# Patient Record
Sex: Female | Born: 2000 | Hispanic: Yes | Marital: Single | Smoking: Never smoker
Health system: Southern US, Community
[De-identification: ages and names within clinical notes are randomized; demographics above are authoritative.]

## PROBLEM LIST (undated history)

## (undated) DIAGNOSIS — O1400 Mild to moderate pre-eclampsia, unspecified trimester: Secondary | ICD-10-CM

## (undated) DIAGNOSIS — G43909 Migraine, unspecified, not intractable, without status migrainosus: Secondary | ICD-10-CM

## (undated) DIAGNOSIS — Q760 Spina bifida occulta: Secondary | ICD-10-CM

## (undated) DIAGNOSIS — E669 Obesity, unspecified: Secondary | ICD-10-CM

## (undated) HISTORY — DX: Obesity, unspecified: E66.9

## (undated) HISTORY — PX: NO PAST SURGERIES: SHX2092

## (undated) HISTORY — DX: Mild to moderate pre-eclampsia, unspecified trimester: O14.00

## (undated) HISTORY — DX: Migraine, unspecified, not intractable, without status migrainosus: G43.909

## (undated) HISTORY — DX: Spina bifida occulta: Q76.0

---
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
## 2022-01-03 ENCOUNTER — Ambulatory Visit (INDEPENDENT_AMBULATORY_CARE_PROVIDER_SITE_OTHER): Payer: Medicaid Other | Admitting: Obstetrics & Gynecology

## 2022-01-03 VITALS — BP 119/88 | HR 81 | Wt 199.6 lb

## 2022-01-03 DIAGNOSIS — D1779 Benign lipomatous neoplasm of other sites: Secondary | ICD-10-CM

## 2022-01-03 DIAGNOSIS — Z6832 Body mass index (BMI) 32.0-32.9, adult: Secondary | ICD-10-CM

## 2022-01-03 DIAGNOSIS — Z3403 Encounter for supervision of normal first pregnancy, third trimester: Secondary | ICD-10-CM

## 2022-01-03 DIAGNOSIS — Z3A38 38 weeks gestation of pregnancy: Secondary | ICD-10-CM

## 2022-01-03 DIAGNOSIS — O99019 Anemia complicating pregnancy, unspecified trimester: Secondary | ICD-10-CM

## 2022-01-03 DIAGNOSIS — G43109 Migraine with aura, not intractable, without status migrainosus: Secondary | ICD-10-CM

## 2022-01-03 NOTE — Progress Notes (Signed)
   PRENATAL VISIT NOTE  Subjective:  Taylor [name] is a 21 y.o. G1P0 at 61w0dbeing seen today for ongoing prenatal care.  She is currently monitored for the following issues for this low-risk pregnancy and has Adjustment disorder of adolescence; Migraine headache; Supervision of normal first pregnancy; Body mass index (BMI) 32.0-32.9, adult; Epidural lipomatosis; and Anemia during pregnancy on their problem list.  Patient reports no complaints.  Contractions: Not present. Vag. Bleeding: None.  Movement: Present. Denies leaking of fluid.   The following portions of the patient's history were reviewed and updated as appropriate: allergies, current medications, past family history, past medical history, past social history, past surgical history and problem list.   Objective:   Vitals:   01/03/22 1107  BP: 119/88  Pulse: 81  Weight: 199 lb 9.6 oz (90.5 kg)    Fetal Status: Fetal Heart Rate (bpm): 138 Fundal Height: 38 cm Movement: Present  Presentation: Vertex  General:  Alert, oriented and cooperative. Patient is in no acute distress.  Skin: Skin is warm and dry. No rash noted.   Cardiovascular: Normal heart rate noted  Respiratory: Normal respiratory effort, no problems with respiration noted  Abdomen: Soft, gravid, appropriate for gestational age.  Pain/Pressure: Present     Pelvic: Cervical exam performed in the presence of a chaperone Dilation: Fingertip Effacement (%): 20 Station: -3  Extremities: Normal range of motion.  Edema: Trace  Mental Status: Normal mood and affect. Normal behavior. Normal judgment and thought content.   Assessment and Plan:  Pregnancy: G1P0 at 358w0d. [redacted] weeks gestation of pregnancy - on PNV, baby ASA  2. Encounter for supervision of normal first pregnancy in third trimester - recheck 1 week - discussed timing of delivery.  Will plan BPP after 40 weeks and go ahead and schedule now.  Pt aware if does not have significant cervical change,  would recommend induction at 41 weeks.  May need foley bulb placement prior to induction as well.    3. Epidural lipomatosis  4. Anemia during pregnancy - on oral iron  5. Migraine with aura and without status migrainosus, not intractable -under good control  6. Body mass index (BMI) 32.0-32.9, adult - had growth scan 12/10/2021 with 19%-tile growth  Term labor symptoms and general obstetric precautions including but not limited to vaginal bleeding, contractions, leaking of fluid and fetal movement were reviewed in detail with the patient. Please refer to After Visit Summary for other counseling recommendations.   Return in about 1 week (around 01/10/2022).  Future Appointments  Date Time Provider [place]8/21/2023 11:00 AM MiMegan SalonMD [facility]  01/17/2022 10:30 AM MiMegan SalonMD [facility]  02/14/2022  9:00 AM StCamillia HerterNP [facility] None    MaMegan SalonMD

## 2022-01-03 NOTE — Addendum Note (Signed)
Addended by: Blenda Nicely on: 01/03/2022 04:31 PM   Modules accepted: Orders

## 2022-01-04 ENCOUNTER — Encounter (HOSPITAL_BASED_OUTPATIENT_CLINIC_OR_DEPARTMENT_OTHER): Payer: Medicaid Other | Admitting: Obstetrics & Gynecology

## 2022-01-10 ENCOUNTER — Ambulatory Visit (INDEPENDENT_AMBULATORY_CARE_PROVIDER_SITE_OTHER): Payer: Medicaid Other | Admitting: Obstetrics & Gynecology

## 2022-01-10 ENCOUNTER — Encounter (HOSPITAL_BASED_OUTPATIENT_CLINIC_OR_DEPARTMENT_OTHER): Payer: Self-pay | Admitting: Obstetrics & Gynecology

## 2022-01-10 VITALS — BP 123/80 | HR 80 | Wt 197.8 lb

## 2022-01-10 DIAGNOSIS — Z6832 Body mass index (BMI) 32.0-32.9, adult: Secondary | ICD-10-CM | POA: Diagnosis not present

## 2022-01-10 DIAGNOSIS — D1779 Benign lipomatous neoplasm of other sites: Secondary | ICD-10-CM | POA: Diagnosis not present

## 2022-01-10 DIAGNOSIS — E882 Lipomatosis, not elsewhere classified: Secondary | ICD-10-CM

## 2022-01-10 DIAGNOSIS — O99019 Anemia complicating pregnancy, unspecified trimester: Secondary | ICD-10-CM

## 2022-01-10 DIAGNOSIS — O99013 Anemia complicating pregnancy, third trimester: Secondary | ICD-10-CM | POA: Diagnosis not present

## 2022-01-10 DIAGNOSIS — Z3403 Encounter for supervision of normal first pregnancy, third trimester: Secondary | ICD-10-CM

## 2022-01-10 DIAGNOSIS — Z3A39 39 weeks gestation of pregnancy: Secondary | ICD-10-CM | POA: Diagnosis not present

## 2022-01-10 NOTE — Progress Notes (Signed)
   PRENATAL VISIT NOTE  Subjective:  Taylor [name] is a 21 y.o. G1P0 at [redacted]w[redacted]d being seen today for ongoing prenatal care.  She is currently monitored for the following issues for this low-risk pregnancy and has Adjustment disorder of adolescence; Migraine headache; Supervision of normal first pregnancy; Body mass index (BMI) 32.0-32.9, adult; Epidural lipomatosis; and Anemia during pregnancy on their problem list.  Patient reports  no complaints .  Contractions: Not present. Vag. Bleeding: None.  Movement: Present. Denies leaking of fluid.   The following portions of the patient's history were reviewed and updated as appropriate: allergies, current medications, past family history, past medical history, past social history, past surgical history and problem list.   Objective:   Vitals:   01/10/22 1129  BP: 123/80  Pulse: 80  Weight: 197 lb 12.8 oz (89.7 kg)    Fetal Status: Fetal Heart Rate (bpm): 132 Fundal Height: 40 cm Movement: Present  Presentation: Vertex  General:  Alert, oriented and cooperative. Patient is in no acute distress.  Skin: Skin is warm and dry. No rash noted.   Cardiovascular: Normal heart rate noted  Respiratory: Normal respiratory effort, no problems with respiration noted  Abdomen: Soft, gravid, appropriate for gestational age.  Pain/Pressure: Present     Pelvic: Cervical exam performed in the presence of a chaperone Dilation: 1 Effacement (%): 30 Station: -3  Extremities: Normal range of motion.  Edema: Trace  Mental Status: Normal mood and affect. Normal behavior. Normal judgment and thought content.   Assessment and Plan:  Pregnancy: G1P0 at [redacted]w[redacted]d 1. [redacted] weeks gestation of pregnancy - on PNV - Recheck 1 week - BPP scheduled for after 40 weeks - induction will be scheduled for 41 weeks  2. Encounter for supervision of normal first pregnancy in third trimester  3. Epidural lipomatosis  4. Body mass index (BMI) 32.0-32.9, adult - growth scan  12/10/2021 with 19th percentile growth at 2225gm  5. Anemia during pregnancy - on iron  Term labor symptoms and general obstetric precautions including but not limited to vaginal bleeding, contractions, leaking of fluid and fetal movement were reviewed in detail with the patient. Please refer to After Visit Summary for other counseling recommendations.   Return in about 1 week (around 01/17/2022) for BPP scheduled for after 40 weeks.  Future Appointments  Date Time Provider Department Center  01/17/2022 10:30 AM Jerene Bears, MD [facility]  01/18/2022  9:45 AM [facility] NURSE [facility]  01/18/2022 10:00 AM [facility] US1 [facility]  02/14/2022  9:00 AM Rema Fendt, NP [facility] None    Jerene Bears, MD

## 2022-01-15 ENCOUNTER — Inpatient Hospital Stay (HOSPITAL_COMMUNITY)
Admission: AD | Admit: 2022-01-15 | Discharge: 2022-01-15 | Disposition: A | Discharge status: Home / Self Care | Payer: Medicaid Other | Attending: Obstetrics & Gynecology | Admitting: Obstetrics & Gynecology

## 2022-01-15 ENCOUNTER — Encounter (HOSPITAL_COMMUNITY): Payer: Self-pay | Admitting: Obstetrics & Gynecology

## 2022-01-15 DIAGNOSIS — O479 False labor, unspecified: Secondary | ICD-10-CM

## 2022-01-15 DIAGNOSIS — Z3A39 39 weeks gestation of pregnancy: Secondary | ICD-10-CM | POA: Diagnosis not present

## 2022-01-15 DIAGNOSIS — O471 False labor at or after 37 completed weeks of gestation: Secondary | ICD-10-CM | POA: Diagnosis not present

## 2022-01-15 DIAGNOSIS — O4693 Antepartum hemorrhage, unspecified, third trimester: Secondary | ICD-10-CM

## 2022-01-15 LAB — URINALYSIS, ROUTINE W REFLEX MICROSCOPIC
Bilirubin Urine: NEGATIVE
Glucose, UA: NEGATIVE mg/dL
Ketones, ur: NEGATIVE mg/dL
Nitrite: NEGATIVE
Protein, ur: NEGATIVE mg/dL
Specific Gravity, Urine: 1.004 — ABNORMAL LOW (ref 1.005–1.030)
pH: 7 (ref 5.0–8.0)

## 2022-01-15 NOTE — MAU Note (Addendum)
.  Taylor [name] is a 21 y.o. at 1w5dhere in MAU reporting: at 330am she had some red spotting with a dime size clot with irregular ctx At 705am she had another episode of a dime sized clot with spotting. Patient stated that she went to the bathroom in the lobby she seen pink discharged after wiping. Patient is reporting of cramping for 4-5 days. She stated that since yesterday the cramping was constant.  + FM reported Patient denies leakage of fluid LMP: 04/18/2021 Onset of complaint: 330am Pain score: 6/10 with ctx 4/10 with cramping and ctx Vitals:   01/15/22 0910  BP: 133/85  Pulse: 84  Resp: 12  Temp: 99 F (37.2 C)  SpO2: 100%     FHT:140  Lab orders placed from beside:   UA

## 2022-01-15 NOTE — Discharge Instructions (Signed)
Reasons to return to MAU at [facility]:  1.  Contractions are  5 minutes apart or less, each last 1 minute, these have been going on for 1-2 hours, and you cannot walk or talk during them 2.  You have a large gush of fluid, or a trickle of fluid that will not stop and you have to wear a pad 3.  You have bleeding that is bright red, heavier than spotting--like menstrual bleeding (spotting can be normal in early labor or after a check of your cervix) 4.  You do not feel the baby moving like he/she normally does  

## 2022-01-15 NOTE — MAU Provider Note (Signed)
Chief Complaint:  Contractions and Vaginal Bleeding   None     HPI: Taylor [name] is a 21 y.o. G1P0 at 54w5dby early ultrasound who presents to maternity admissions reporting irregular contractions since yesterday 01/14/22, with 2 small dime sized clots seen when wiping this morning. The bleeding has not been enough to require a pad or pantyliner.   She reports good fetal movement, denies LOF, vaginal itching/burning, urinary symptoms, h/a, dizziness, n/v, or fever/chills.      HPI  Past Medical History: Past Medical History:  Diagnosis Date   History of acute pancreatitis 05/2020   due to gallstone pancreatitis   Migraines    Obesity    Spina bifida occulta     Past obstetric history: OB History  Gravida Para Term Preterm AB Living  1            SAB IAB Ectopic Multiple Live Births               # Outcome Date GA Lbr Len/2nd Weight Sex Delivery Anes PTL Lv  1 Current             Past Surgical History: Past Surgical History:  Procedure Laterality Date   CHOLECYSTECTOMY N/A 06/12/2020   Procedure: LAPAROSCOPIC CHOLECYSTECTOMY WITH INTRAOPERATIVE CHOLANGIOGRAM;  Surgeon: WGreer Pickerel MD;  [facility]  Service: General;  Laterality: N/A;    Family History: Family History  Problem Relation Age of Onset   Anxiety disorder Mother    Asthma Sister    Hypertension Maternal Aunt    Cancer Maternal Uncle    Migraines Neg Hx    Seizures Neg Hx    Autism Neg Hx    Depression Neg Hx    ADD / ADHD Neg Hx    Bipolar disorder Neg Hx    Schizophrenia Neg Hx    Birth defects Neg Hx    Diabetes Neg Hx    Heart disease Neg Hx    Stroke Neg Hx     Social History: Social History   Tobacco Use   Smoking status: Never   Smokeless tobacco: Never  Vaping Use   Vaping Use: Never used  Substance Use Topics   Alcohol use: Never   Drug use: Never    Allergies: No Known Allergies  Meds:  Medications Prior to Admission  Medication Sig Dispense Refill Last  Dose   aspirin EC 81 MG tablet Take 1 tablet (81 mg total) by mouth daily. Swallow whole. 30 tablet 11 01/14/2022   ferrous sulfate 324 (65 Fe) MG TBEC Take 1 tablet (325 mg total) by mouth every other day. 15 tablet 11 01/14/2022   Prenatal Vit-Fe Fumarate-FA (PRENATAL MULTIVITAMIN) TABS tablet Take 1 tablet by mouth daily at 12 noon.   01/14/2022    ROS:  Review of Systems  Constitutional:  Negative for chills, fatigue and fever.  Eyes:  Negative for visual disturbance.  Respiratory:  Negative for shortness of breath.   Cardiovascular:  Negative for chest pain.  Gastrointestinal:  Positive for abdominal pain. Negative for nausea and vomiting.  Genitourinary:  Positive for vaginal bleeding. Negative for difficulty urinating, dysuria, flank pain, pelvic pain, vaginal discharge and vaginal pain.  Neurological:  Negative for dizziness and headaches.  Psychiatric/Behavioral: Negative.       I have reviewed patient's Past Medical Hx, Surgical Hx, Family Hx, Social Hx, medications and allergies.   Physical Exam  Patient Vitals for the past 24 hrs:  BP Temp Temp src Pulse  Resp SpO2 Height Weight  01/15/22 0910 133/85 99 F (37.2 C) Oral 84 12 100 % 5' (1.524 m) 90.6 kg   Constitutional: Well-developed, well-nourished female in no acute distress.  Cardiovascular: normal rate Respiratory: normal effort GI: Abd soft, non-tender, gravid appropriate for gestational age.  MS: Extremities nontender, no edema, normal ROM Neurologic: Alert and oriented x 4.  GU: Neg CVAT.  PELVIC EXAM:   Dilation: 1 Effacement (%): Thick Station: -3 Presentation: Vertex Exam by:: Elray Mcgregor, RN  FHT:  Baseline 140 , moderate variability, accelerations present, no decelerations Contractions: q 4-8 mins, mild to palpation   Labs: Results for orders placed or performed during the hospital encounter of 01/15/22 (from the past 24 hour(s))  Urinalysis, Routine w reflex microscopic     Status: Abnormal    Collection Time: 01/15/22  9:16 AM  Result Value Ref Range   Color, Urine YELLOW YELLOW   APPearance CLEAR CLEAR   Specific Gravity, Urine 1.004 (L) 1.005 - 1.030   pH 7.0 5.0 - 8.0   Glucose, UA NEGATIVE NEGATIVE mg/dL   Hgb urine dipstick LARGE (A) NEGATIVE   Bilirubin Urine NEGATIVE NEGATIVE   Ketones, ur NEGATIVE NEGATIVE mg/dL   Protein, ur NEGATIVE NEGATIVE mg/dL   Nitrite NEGATIVE NEGATIVE   Leukocytes,Ua TRACE (A) NEGATIVE   RBC / HPF 0-5 0 - 5 RBC/hpf   WBC, UA 6-10 0 - 5 WBC/hpf   Bacteria, UA RARE (A) NONE SEEN   Squamous Epithelial / LPF 0-5 0 - 5   Mucus PRESENT    A/Positive/-- (02/06 1512)  Imaging:  No results found.  MAU Course/MDM: Orders Placed This Encounter  Procedures   Urinalysis, Routine w reflex microscopic   Discharge patient    No orders of the defined types were placed in this encounter.    NST reviewed and reactive Discussed vaginal bleeding and possible causes with pt, including early labor and cervical change as well as placental problems including abruption I recommended admission and IOL with vaginal bleeding with clots at 39 weeks. Pt does not desire IOL and wants to go home, and return when more in labor Given normal FHR tracing, pt feeling good movement, and bleeding now resolved in MAU, pt given risks but discharged to return with s/sx of labor or increasing bleeding Return to MAU as needed for emergencies   Assessment: 1. Vaginal bleeding in pregnancy, third trimester   2. Threatened labor at term   3. [redacted] weeks gestation of pregnancy     Plan: Discharge home Labor precautions and fetal kick counts  Follow-up [facility] Follow up.   Specialty: Cardiology Why: As scheduled Contact information: [address] [place] [address] [facility] up.   Specialty: Obstetrics and Gynecology Why: For  signs of labor or emergencies Contact information: [address] [id number] [place] [phone]               Allergies as of 01/15/2022   No Known Allergies      Medication List     TAKE these medications    aspirin EC 81 MG tablet Take 1 tablet (81 mg total) by mouth daily. Swallow whole.   ferrous sulfate 324 (65 Fe) MG Tbec Take 1 tablet (325 mg total) by mouth every other day.   prenatal multivitamin Tabs tablet Take 1 tablet by mouth daily  at 12 noon.        Fatima Blank Certified Nurse-Midwife 01/15/2022 10:42 AM

## 2022-01-17 ENCOUNTER — Inpatient Hospital Stay (HOSPITAL_COMMUNITY): Payer: Medicaid Other | Admitting: Anesthesiology

## 2022-01-17 ENCOUNTER — Encounter (HOSPITAL_COMMUNITY): Payer: Self-pay | Admitting: Obstetrics and Gynecology

## 2022-01-17 ENCOUNTER — Encounter (HOSPITAL_BASED_OUTPATIENT_CLINIC_OR_DEPARTMENT_OTHER): Payer: Medicaid Other | Admitting: Obstetrics & Gynecology

## 2022-01-17 ENCOUNTER — Inpatient Hospital Stay (HOSPITAL_COMMUNITY)
Admission: AD | Admit: 2022-01-17 | Discharge: 2022-01-20 | DRG: 807 | Disposition: A | Discharge status: Home / Self Care | Payer: Medicaid Other | Attending: Obstetrics and Gynecology | Admitting: Obstetrics and Gynecology

## 2022-01-17 ENCOUNTER — Other Ambulatory Visit: Payer: Self-pay

## 2022-01-17 DIAGNOSIS — Z3A40 40 weeks gestation of pregnancy: Secondary | ICD-10-CM

## 2022-01-17 DIAGNOSIS — O1494 Unspecified pre-eclampsia, complicating childbirth: Secondary | ICD-10-CM | POA: Diagnosis not present

## 2022-01-17 DIAGNOSIS — O1490 Unspecified pre-eclampsia, unspecified trimester: Secondary | ICD-10-CM

## 2022-01-17 DIAGNOSIS — O99214 Obesity complicating childbirth: Secondary | ICD-10-CM | POA: Diagnosis not present

## 2022-01-17 DIAGNOSIS — O9902 Anemia complicating childbirth: Secondary | ICD-10-CM | POA: Diagnosis not present

## 2022-01-17 DIAGNOSIS — O480 Post-term pregnancy: Secondary | ICD-10-CM | POA: Diagnosis not present

## 2022-01-17 DIAGNOSIS — D649 Anemia, unspecified: Secondary | ICD-10-CM | POA: Diagnosis not present

## 2022-01-17 DIAGNOSIS — O26893 Other specified pregnancy related conditions, third trimester: Secondary | ICD-10-CM | POA: Diagnosis not present

## 2022-01-17 DIAGNOSIS — Z7982 Long term (current) use of aspirin: Secondary | ICD-10-CM

## 2022-01-17 LAB — CBC
HCT: 29.2 % — ABNORMAL LOW (ref 36.0–46.0)
HCT: 28.7 % — ABNORMAL LOW (ref 36.0–46.0)
Hemoglobin: 9.2 g/dL — ABNORMAL LOW (ref 12.0–15.0)
Hemoglobin: 9.5 g/dL — ABNORMAL LOW (ref 12.0–15.0)
MCH: 24.6 pg — ABNORMAL LOW (ref 26.0–34.0)
MCH: 24.9 pg — ABNORMAL LOW (ref 26.0–34.0)
MCHC: 31.5 g/dL (ref 30.0–36.0)
MCHC: 33.1 g/dL (ref 30.0–36.0)
MCV: 78.1 fL — ABNORMAL LOW (ref 80.0–100.0)
MCV: 75.1 fL — ABNORMAL LOW (ref 80.0–100.0)
Platelets: 386 10*3/uL (ref 150–400)
Platelets: 361 10*3/uL (ref 150–400)
RBC: 3.74 MIL/uL — ABNORMAL LOW (ref 3.87–5.11)
RBC: 3.82 MIL/uL — ABNORMAL LOW (ref 3.87–5.11)
RDW: 14.8 % (ref 11.5–15.5)
RDW: 15 % (ref 11.5–15.5)
WBC: 14.5 10*3/uL — ABNORMAL HIGH (ref 4.0–10.5)
WBC: 20.1 10*3/uL — ABNORMAL HIGH (ref 4.0–10.5)
nRBC: 0.1 % (ref 0.0–0.2)
nRBC: 0 % (ref 0.0–0.2)

## 2022-01-17 LAB — COMPREHENSIVE METABOLIC PANEL
ALT: 10 U/L (ref 0–44)
AST: 20 U/L (ref 15–41)
Albumin: 2.4 g/dL — ABNORMAL LOW (ref 3.5–5.0)
Alkaline Phosphatase: 224 U/L — ABNORMAL HIGH (ref 38–126)
Anion gap: 6 (ref 5–15)
BUN: 7 mg/dL (ref 6–20)
CO2: 21 mmol/L — ABNORMAL LOW (ref 22–32)
Calcium: 8.5 mg/dL — ABNORMAL LOW (ref 8.9–10.3)
Chloride: 108 mmol/L (ref 98–111)
Creatinine, Ser: 0.68 mg/dL (ref 0.44–1.00)
GFR, Estimated: >60 mL/min (ref >60)
Glucose, Bld: 88 mg/dL (ref 70–99)
Potassium: 3.3 mmol/L — ABNORMAL LOW (ref 3.5–5.1)
Sodium: 135 mmol/L (ref 135–145)
Total Bilirubin: 0.4 mg/dL (ref 0.3–1.2)
Total Protein: 6 g/dL — ABNORMAL LOW (ref 6.5–8.1)

## 2022-01-17 LAB — RPR: RPR Ser Ql: NONREACTIVE

## 2022-01-17 LAB — TYPE AND SCREEN
ABO/RH(D): A POS
Antibody Screen: NEGATIVE

## 2022-01-17 LAB — PROTEIN / CREATININE RATIO, URINE
Creatinine, Urine: 82 mg/dL
Protein Creatinine Ratio: 0.33 mg/mg{Cre} — ABNORMAL HIGH (ref 0.00–0.15)
Total Protein, Urine: 27 mg/dL

## 2022-01-17 MED ORDER — FENTANYL CITRATE (PF) 100 MCG/2ML IJ SOLN
100.0000 ug | INTRAMUSCULAR | Status: DC | PRN
  Administered 2022-01-17 (×3): 100 ug via INTRAVENOUS
  Filled 2022-01-17 (×3): qty 2

## 2022-01-17 MED ORDER — OXYTOCIN-SODIUM CHLORIDE 30-0.9 UT/500ML-% IV SOLN
1.0000 m[IU]/min | INTRAVENOUS | Status: DC
  Administered 2022-01-17: 2 m[IU]/min via INTRAVENOUS
  Filled 2022-01-17: qty 500

## 2022-01-17 MED ORDER — DIPHENHYDRAMINE HCL 50 MG/ML IJ SOLN: 12.5000 mg | INTRAMUSCULAR | Status: DC | PRN

## 2022-01-17 MED ORDER — LIDOCAINE HCL (PF) 1 % IJ SOLN
INTRAMUSCULAR | Status: DC | PRN
  Administered 2022-01-17 (×2): 4 mL via EPIDURAL

## 2022-01-17 MED ORDER — OXYTOCIN BOLUS FROM INFUSION
333.0000 mL | Freq: Once | INTRAVENOUS | Status: AC
  Administered 2022-01-18: 333 mL via INTRAVENOUS

## 2022-01-17 MED ORDER — LACTATED RINGERS IV SOLN
500.0000 mL | Freq: Once | INTRAVENOUS | Status: AC
  Administered 2022-01-17: 500 mL via INTRAVENOUS

## 2022-01-17 MED ORDER — SOD CITRATE-CITRIC ACID 500-334 MG/5ML PO SOLN: 30.0000 mL | ORAL | Status: DC | PRN

## 2022-01-17 MED ORDER — LACTATED RINGERS IV SOLN: 500.0000 mL | INTRAVENOUS | Status: DC | PRN

## 2022-01-17 MED ORDER — OXYTOCIN-SODIUM CHLORIDE 30-0.9 UT/500ML-% IV SOLN
2.5000 [IU]/h | INTRAVENOUS | Status: DC
  Administered 2022-01-18: 2.5 [IU]/h via INTRAVENOUS

## 2022-01-17 MED ORDER — LACTATED RINGERS IV SOLN: INTRAVENOUS | Status: DC

## 2022-01-17 MED ORDER — FENTANYL-BUPIVACAINE-NACL 0.5-0.125-0.9 MG/250ML-% EP SOLN
12.0000 mL/h | EPIDURAL | Status: DC | PRN
  Administered 2022-01-17: 12 mL/h via EPIDURAL
  Filled 2022-01-17: qty 250

## 2022-01-17 MED ORDER — TERBUTALINE SULFATE 1 MG/ML IJ SOLN
0.2500 mg | Freq: Once | INTRAMUSCULAR | Status: DC | PRN
Start: 2022-01-17 — End: 2022-01-18

## 2022-01-17 MED ORDER — ONDANSETRON HCL 4 MG/2ML IJ SOLN: 4.0000 mg | Freq: Four times a day (QID) | INTRAMUSCULAR | Status: DC | PRN

## 2022-01-17 MED ORDER — ACETAMINOPHEN 325 MG PO TABS: 650.0000 mg | ORAL_TABLET | ORAL | Status: DC | PRN

## 2022-01-17 MED ORDER — EPHEDRINE 5 MG/ML INJ: 10.0000 mg | INTRAVENOUS | Status: DC | PRN

## 2022-01-17 MED ORDER — PHENYLEPHRINE 80 MCG/ML (10ML) SYRINGE FOR IV PUSH (FOR BLOOD PRESSURE SUPPORT): 80.0000 ug | PREFILLED_SYRINGE | INTRAVENOUS | Status: DC | PRN

## 2022-01-17 MED ORDER — OXYCODONE-ACETAMINOPHEN 5-325 MG PO TABS: 1.0000 | ORAL_TABLET | ORAL | Status: DC | PRN

## 2022-01-17 MED ORDER — LIDOCAINE HCL (PF) 1 % IJ SOLN
30.0000 mL | INTRAMUSCULAR | Status: AC | PRN
Start: 2022-01-17 — End: 2022-01-18
  Administered 2022-01-18: 30 mL via SUBCUTANEOUS
  Filled 2022-01-17: qty 30

## 2022-01-17 MED ORDER — PHENYLEPHRINE 80 MCG/ML (10ML) SYRINGE FOR IV PUSH (FOR BLOOD PRESSURE SUPPORT)
80.0000 ug | PREFILLED_SYRINGE | INTRAVENOUS | Status: DC | PRN
Start: 2022-01-17 — End: 2022-01-18

## 2022-01-17 MED ORDER — OXYCODONE-ACETAMINOPHEN 5-325 MG PO TABS: 2.0000 | ORAL_TABLET | ORAL | Status: DC | PRN

## 2022-01-17 MED ORDER — FLEET ENEMA 7-19 GM/118ML RE ENEM: 1.0000 | ENEMA | RECTAL | Status: DC | PRN

## 2022-01-17 NOTE — H&P (Addendum)
OBSTETRIC ADMISSION HISTORY AND PHYSICAL  Taylor [name] is a 21 y.o. female G1P0 with IUP at 33w0dby 11w UKoreapresenting for regular contractions every 3 minutes. She reports +FMs, No LOF, no VB, no blurry vision, headaches or peripheral edema, and RUQ pain.  She plans on breast feeding. She is undecided on post partum birth control She [facility] --->  Estimated Date of Delivery: 01/17/22  Sono:    '@[redacted]w[redacted]d'$ , CWD, normal anatomy, breech presentation, 2225g, 19% EFW   Prenatal History/Complications: Obesity  Past Medical History: Past Medical History:  Diagnosis Date   History of acute pancreatitis 05/2020   due to gallstone pancreatitis   Migraines    Obesity    Spina bifida occulta     Past Surgical History: Past Surgical History:  Procedure Laterality Date   CHOLECYSTECTOMY N/A 06/12/2020   Procedure: LAPAROSCOPIC CHOLECYSTECTOMY WITH INTRAOPERATIVE CHOLANGIOGRAM;  Surgeon: WGreer Pickerel MD;  [facility]  Service: General;  Laterality: N/A;    Obstetrical History: OB History     Gravida  1   Para      Term      Preterm      AB      Living         SAB      IAB      Ectopic      Multiple      Live Births              Social History Social History   Socioeconomic History   Marital status: Single    Spouse name: Not on file   Number of children: Not on file   Years of education: Not on file   Highest education level: Not on file  Occupational History   Not on file  Tobacco Use   Smoking status: Never   Smokeless tobacco: Never  Vaping Use   Vaping Use: Never used  Substance and Sexual Activity   Alcohol use: Never   Drug use: Never   Sexual activity: Yes  Other Topics Concern   Not on file  Social History Narrative   Taylor [name] lives at home with mom, dad and siblings. She is a fMuseum/gallery exhibitions officerin college. She does well in school. She enjoys reading, writing and singing   Social  Determinants of Health   Financial Resource Strain: Low Risk  (06/28/2021)   Overall Financial Resource Strain (CARDIA)    Difficulty of Paying Living Expenses: Not hard at all  Food Insecurity: No Food Insecurity (06/28/2021)   Hunger Vital Sign    Worried About Running Out of Food in the Last Year: Never true    RPoplar[place]Cotton Centerin the Last Year: Never true  Transportation Needs: No Transportation Needs (06/28/2021)   PRAPARE - THydrologist(Medical): No    Lack of Transportation (Non-Medical): No  Physical Activity: Sufficiently Active (06/28/2021)   Exercise Vital Sign    Days of Exercise per Week: 5 days    Minutes of Exercise per Session: 30 min  Stress: No Stress Concern Present (06/28/2021)   FM[place]   Feeling of Stress : Only a little  Social Connections: Unknown (06/28/2021)   Social Connection and Isolation Panel [NHANES]    Frequency of Communication with Friends and Family: More than three times a week    Frequency of Social  Gatherings with Friends and Family: Three times a week    Attends Religious Services: More than 4 times per year    Active Member of Clubs or Organizations: No    Attends Archivist Meetings: Never    Marital Status: Patient refused    Family History: Family History  Problem Relation Age of Onset   Anxiety disorder Mother    Asthma Sister    Hypertension Maternal Aunt    Cancer Maternal Uncle    Migraines Neg Hx    Seizures Neg Hx    Autism Neg Hx    Depression Neg Hx    ADD / ADHD Neg Hx    Bipolar disorder Neg Hx    Schizophrenia Neg Hx    Birth defects Neg Hx    Diabetes Neg Hx    Heart disease Neg Hx    Stroke Neg Hx     Allergies: No Known Allergies  Medications Prior to Admission  Medication Sig Dispense Refill Last Dose   aspirin EC 81 MG tablet Take 1 tablet (81 mg total) by mouth daily. Swallow whole. 30 tablet 11 01/16/2022    ferrous sulfate 324 (65 Fe) MG TBEC Take 1 tablet (325 mg total) by mouth every other day. 15 tablet 11 01/16/2022   Prenatal Vit-Fe Fumarate-FA (PRENATAL MULTIVITAMIN) TABS tablet Take 1 tablet by mouth daily at 12 noon.   01/16/2022     Review of Systems   All systems reviewed and negative except as stated in HPI  Blood pressure (!) 134/92, pulse 76, temperature 98 F (36.7 C), temperature source Oral, last menstrual period 04/18/2021, SpO2 100 %. General appearance: alert, cooperative, and appears stated age Lungs: clear to auscultation bilaterally Heart: regular rate and rhythm Abdomen: soft, non-tender; bowel sounds normal Pelvic: no abnormalities Extremities: Homans sign is negative, no sign of DVT DTR's intact Presentation: unsure, breech per latest US Fetal monitoringBaseline: 135 bpm, Variability: Good {> 6 bpm), Accelerations: Reactive, and Decelerations: Absent Uterine activityFrequency: Every 3 minutes  Dilation: 1.5 Effacement (%): 70 Station: -2 Exam by:: Ardelle Lesches, RN   Prenatal labs: ABO, Rh: A/Positive/-- (02/06 1512) Antibody: Negative (02/06 1512) Rubella: 4.08 (02/06 1512) RPR: Non Reactive (05/26 0822)  HBsAg: Negative (02/06 1512)  HIV: Non Reactive (05/26 1610)  GBS: Negative/-- (07/31 1140)  1 hr Glucola 75 Genetic screening  NIPS: low risk, Horizon negative 4/4 Anatomy US no abnormalities  Prenatal Transfer Tool  Maternal Diabetes: No Genetic Screening: Normal Maternal Ultrasounds/Referrals: Normal Fetal Ultrasounds or other Referrals:  Referred to Materal Fetal Medicine  Maternal Substance Abuse:  No Significant Maternal Medications:  None Significant Maternal Lab Results: Group B Strep negative  No results found for this or any previous visit (from the past 24 hour(s)).  Patient Active Problem List   Diagnosis Date Noted   Anemia during pregnancy 11/22/2021   Epidural lipomatosis 10/16/2021   Body mass index (BMI) 32.0-32.9, adult  06/29/2021   Supervision of normal first pregnancy 06/28/2021   Migraine headache 03/16/2017   Adjustment disorder of adolescence 10/01/2014    Assessment/Plan:  Taylor [name] is a 21 y.o. G1P0 at 42w0dhere for early labor with elevated blood pressures  #Labor: Discussed early induction techniques including the use of foley balloon and cytotec. Patient agreed to augmentation as required. Will plan augmentation based on next cervical exam.  #Pain: IV pain medications. No epidural at this time.  #FWB: Cat I Tracing #ID:  GBS negative  #MOF: Breast #MOC: Undecided #Circ:  No  Apolonio Schneiders, MD  Resident Physician 01/17/2022, 4:40 AM   Attestation of Supervision of Student:  I confirm that I have verified the information documented in the  resident  student's note and that I have also personally reperformed the history, physical exam and all medical decision making activities.  I have verified that all services and findings are accurately documented in this student's note; and I agree with management and plan as outlined in the documentation. I have also made any necessary editorial changes.   [place], [place] for [employer], [employer] 01/17/2022 7:34 AM

## 2022-01-17 NOTE — MAU Note (Signed)
..  Taylor [name] is a 21 y.o. at 97w0dhere in MAU reporting: contractions since 0130 that are now every 5-6 minutes. Denies vaginal bleeding or leaking of fluid. +FM   Onset of complaint: 0130 Pain score: 8/10

## 2022-01-17 NOTE — Anesthesia Preprocedure Evaluation (Signed)
Anesthesia Evaluation  Patient identified by MRN, date of birth, ID band Patient awake    Reviewed: Allergy & Precautions, Patient's Chart, lab work & pertinent test results  History of Anesthesia Complications Negative for: history of anesthetic complications  Airway Mallampati: II  TM Distance: >3 FB Neck ROM: Full    Dental no notable dental hx.    Pulmonary neg pulmonary ROS,    Pulmonary exam normal        Cardiovascular Normal cardiovascular exam     Neuro/Psych  Headaches, L5 and sacral epidural lipomatosis    GI/Hepatic negative GI ROS, Neg liver ROS,   Endo/Other  negative endocrine ROS  Renal/GU negative Renal ROS  negative genitourinary   Musculoskeletal negative musculoskeletal ROS (+)   Abdominal   Peds  Hematology  (+) Blood dyscrasia (Hgb 9.5), anemia ,   Anesthesia Other Findings Day of surgery medications reviewed with patient.  Reproductive/Obstetrics (+) Pregnancy                             Anesthesia Physical Anesthesia Plan  ASA: 2  Anesthesia Plan: Epidural   Post-op Pain Management:    Induction:   PONV Risk Score and Plan: Treatment may vary due to age or medical condition  Airway Management Planned: Natural Airway  Additional Equipment: Fetal Monitoring  Intra-op Plan:   Post-operative Plan:   Informed Consent: I have reviewed the patients History and Physical, chart, labs and discussed the procedure including the risks, benefits and alternatives for the proposed anesthesia with the patient or authorized representative who has indicated his/her understanding and acceptance.       Plan Discussed with:   Anesthesia Plan Comments:         Anesthesia Quick Evaluation

## 2022-01-17 NOTE — Progress Notes (Signed)
Labor Progress Note Maddisyn Casidy Alberta is a 21 y.o. G1P0 at 1w0dpresented for SOL  S: Pt feeling a lot of pain, considering epidural  O:  BP (!) 135/95   Pulse 97   Temp 98.5 F (36.9 C)   Resp 18   LMP 04/18/2021   SpO2 100%  EFM: 140bpm/Moderate variability/ 15x15 accels/ None decels  CVE: Dilation: 6 Effacement (%): 80 Cervical Position: Middle Station: -1 Presentation: Vertex Exam by:: Dr. MClement Husbands  A&P: 89y.o. G1P0 467w0dOL #Labor: Pt still at 6cm for the past 3 checks. Start pitocin #Pain: Considering epidural #FWB: CAT 1 #GBS negative #Pre-E w/o SF: watching BP for severe ranges  JeShelda PalDO 5:23 PM

## 2022-01-17 NOTE — Progress Notes (Signed)
Labor Progress Note Michelle Aizah Gehlhausen is a 21 y.o. G1P0 at 49w0dpresented for SOL  S: Pt feeling contractions, tolerating them well.  O:  BP 137/87   Pulse 75   Temp 98.1 F (36.7 C) (Oral)   Resp 18   LMP 04/18/2021   SpO2 100%  EFM: 125 bpm/Moderate variability/ 15x15 accels/ None decels  CVE: Dilation: 2 Effacement (%): 70 Cervical Position: Middle Station: -2 Presentation: Vertex Exam by:: Dr MClement Husbands  A&P: 89y.o. G1P0 435w0dere for SOL #Labor: Progressing well. FB in 0850 #Pain: IV pain meds, nitrous oxide, Does not want epidural #FWB: CAT 1 #GBS negative #Elevated BP: BP's 130-140s/80-90s since admission. CMP nml, awaiting P/C. CTM  JeErnestine Conradercado-Ortiz, DO 9:01 AM

## 2022-01-17 NOTE — Progress Notes (Addendum)
Labor Progress Note Taylor [name] is a 21 y.o. G1P0 at 37w0dpresented for SOL.  S: Pushing well. Tired.    O:  BP (!) 138/98   Pulse (!) 118   Temp 97.7 F (36.5 C) (Oral)   Resp 16   LMP 04/18/2021   SpO2 100%  EFM: 135/moderate variability/(+) acels, no decels  CVE: Dilation: Lip/rim Effacement (%): 100 Cervical Position: Middle Station: Plus 1 Presentation: Vertex Exam by:: AMarcene Duos RN   A&P: 21y.o. G1P0 434w0domplete and laboring #Labor: Progressing well. Pushed for about 45 minutes. Moved fetus from -2 to 0 station. Patient desires to rest. Will labor down and recheck in an hour #Pain: Epidural in place #FWB: Cat I #GBS negative   CoApolonio SchneidersMD Resident Physician 01/17/2022 10:40 PM   GME ATTESTATION:  I saw and evaluated the patient. I agree with the findings and the plan of care as documented in the resident's note. I have made changes to documentation as necessary.  SiGerlene FeeDO OB Fellow, [facility]/28/2023, 11:01 PM

## 2022-01-17 NOTE — Anesthesia Procedure Notes (Signed)
Epidural Patient location during procedure: OB Start time: 01/17/2022 5:34 PM End time: 01/17/2022 5:37 PM  Staffing Anesthesiologist: Brennan Bailey, MD Performed: anesthesiologist   Preanesthetic Checklist Completed: patient identified, IV checked, risks and benefits discussed, monitors and equipment checked, pre-op evaluation and timeout performed  Epidural Patient position: sitting Prep: DuraPrep and site prepped and draped Patient monitoring: continuous pulse ox, blood pressure and heart rate Approach: midline Location: L3-L4 Injection technique: LOR air  Needle:  Needle type: Tuohy  Needle gauge: 17 G Needle length: 9 cm Needle insertion depth: 5 cm Catheter type: closed end flexible Catheter size: 19 Gauge Catheter at skin depth: 10 cm Test dose: negative and Other (1% lidocaine)  Assessment Events: blood not aspirated, injection not painful, no injection resistance, no paresthesia and negative IV test  Additional Notes Patient identified. Risks, benefits, and alternatives discussed with patient including but not limited to bleeding, infection, nerve damage, paralysis, failed block, incomplete pain control, headache, blood pressure changes, nausea, vomiting, reactions to medication, itching, and postpartum back pain. Confirmed with bedside nurse the patient's most recent platelet count. Confirmed with patient that they are not currently taking any anticoagulation, have any bleeding history, or any family history of bleeding disorders. Patient expressed understanding and wished to proceed. All questions were answered. Sterile technique was used throughout the entire procedure. Please see nursing notes for vital signs.   Crisp LOR on first pass. Test dose was given through epidural catheter and negative prior to continuing to dose epidural or start infusion. Warning signs of high block given to the patient including shortness of breath, tingling/numbness in hands, complete  motor block, or any concerning symptoms with instructions to call for help. Patient was given instructions on fall risk and not to get out of bed. All questions and concerns addressed with instructions to call with any issues or inadequate analgesia.  Reason for block:procedure for pain

## 2022-01-17 NOTE — Progress Notes (Addendum)
Labor Progress Note Taylor [name] is a 21 y.o. G1P0 at 80w0dpresented for SOL S: Patient is uncomfortable with contractions and requesting IV pain medication. SROM clear fluid.   O:  BP 132/86   Pulse 82   Temp 98.3 F (36.8 C) (Oral)   Resp 18   LMP 04/18/2021   SpO2 100%  EFM: 135/accels present/moderate variability/no decels  CVE: Dilation: 6 Effacement (%): 80 Cervical Position: Middle Station: -1 Presentation: Vertex Exam by:: dr. lBryson [name]  A&P: 21y.o. G1P0 42w0ddmitted for SOL  #Labor: Progressing well, with SROM clear fluid.  #Pain: Increasing with contractions, IV fentanyl requested2 #FWB: Cat I #GBS negative Awaiting Pro/Cr. CTM  AlJanice NorrieMD PGY3 Center for [facility] 11:31 AM   GME ATTESTATION:  Evaluation and management procedures were performed by the [facility] Resident under my supervision. I was immediately available for direct supervision, assistance and direction throughout this encounter.  I also confirm that I have verified the information documented in the resident's note, and that I have also personally reperformed the pertinent components of the physical exam and all of the medical decision making activities.  I have also made any necessary editorial changes.  JeShelda PalDO OB Fellow, [facility]/28/2023 2:09 PM

## 2022-01-18 ENCOUNTER — Encounter (HOSPITAL_COMMUNITY): Payer: Self-pay | Admitting: Student

## 2022-01-18 ENCOUNTER — Ambulatory Visit: Payer: Medicaid Other

## 2022-01-18 DIAGNOSIS — Z3A40 40 weeks gestation of pregnancy: Secondary | ICD-10-CM | POA: Diagnosis not present

## 2022-01-18 DIAGNOSIS — O9902 Anemia complicating childbirth: Secondary | ICD-10-CM | POA: Diagnosis not present

## 2022-01-18 LAB — CBC WITH DIFFERENTIAL/PLATELET
Abs Immature Granulocytes: 0 10*3/uL (ref 0.00–0.07)
Basophils Absolute: 0 10*3/uL (ref 0.0–0.1)
Basophils Relative: 0 %
Eosinophils Absolute: 0 10*3/uL (ref 0.0–0.5)
Eosinophils Relative: 0 %
HCT: 26.4 % — ABNORMAL LOW (ref 36.0–46.0)
Hemoglobin: 8.6 g/dL — ABNORMAL LOW (ref 12.0–15.0)
Lymphocytes Relative: 8 %
Lymphs Abs: 2.2 10*3/uL (ref 0.7–4.0)
MCH: 24.9 pg — ABNORMAL LOW (ref 26.0–34.0)
MCHC: 32.6 g/dL (ref 30.0–36.0)
MCV: 76.3 fL — ABNORMAL LOW (ref 80.0–100.0)
Monocytes Absolute: 0.6 10*3/uL (ref 0.1–1.0)
Monocytes Relative: 2 %
Neutro Abs: 24.8 10*3/uL — ABNORMAL HIGH (ref 1.7–7.7)
Neutrophils Relative %: 90 %
Platelets: 328 10*3/uL (ref 150–400)
RBC: 3.46 MIL/uL — ABNORMAL LOW (ref 3.87–5.11)
RDW: 15.1 % (ref 11.5–15.5)
WBC: 27.5 10*3/uL — ABNORMAL HIGH (ref 4.0–10.5)
nRBC: 0 % (ref 0.0–0.2)
nRBC: 0 /100 WBC

## 2022-01-18 MED ORDER — SENNOSIDES-DOCUSATE SODIUM 8.6-50 MG PO TABS
2.0000 | ORAL_TABLET | Freq: Every day | ORAL | Status: DC
  Administered 2022-01-20: 1 via ORAL
  Filled 2022-01-18: qty 2

## 2022-01-18 MED ORDER — WITCH HAZEL-GLYCERIN EX PADS: 1.0000 | MEDICATED_PAD | CUTANEOUS | Status: DC | PRN

## 2022-01-18 MED ORDER — PRENATAL MULTIVITAMIN CH
1.0000 | ORAL_TABLET | Freq: Every day | ORAL | Status: DC
  Administered 2022-01-18 – 2022-01-20 (×3): 1 via ORAL
  Filled 2022-01-18 (×3): qty 1

## 2022-01-18 MED ORDER — DIBUCAINE (PERIANAL) 1 % EX OINT: 1.0000 | TOPICAL_OINTMENT | CUTANEOUS | Status: DC | PRN

## 2022-01-18 MED ORDER — ONDANSETRON HCL 4 MG/2ML IJ SOLN
4.0000 mg | INTRAMUSCULAR | Status: DC | PRN
  Filled 2022-01-18: qty 2

## 2022-01-18 MED ORDER — BENZOCAINE-MENTHOL 20-0.5 % EX AERO
1.0000 | INHALATION_SPRAY | CUTANEOUS | Status: DC | PRN
  Filled 2022-01-18: qty 56

## 2022-01-18 MED ORDER — ACETAMINOPHEN 325 MG PO TABS: 650.0000 mg | ORAL_TABLET | ORAL | Status: DC | PRN

## 2022-01-18 MED ORDER — ONDANSETRON HCL 4 MG PO TABS: 4.0000 mg | ORAL_TABLET | ORAL | Status: DC | PRN

## 2022-01-18 MED ORDER — TETANUS-DIPHTH-ACELL PERTUSSIS 5-2.5-18.5 LF-MCG/0.5 IM SUSY: 0.5000 mL | PREFILLED_SYRINGE | Freq: Once | INTRAMUSCULAR | Status: DC

## 2022-01-18 MED ORDER — IBUPROFEN 600 MG PO TABS
600.0000 mg | ORAL_TABLET | Freq: Four times a day (QID) | ORAL | Status: DC
  Administered 2022-01-18 – 2022-01-20 (×10): 600 mg via ORAL
  Filled 2022-01-18 (×10): qty 1

## 2022-01-18 MED ORDER — DIPHENHYDRAMINE HCL 25 MG PO CAPS: 25.0000 mg | ORAL_CAPSULE | Freq: Four times a day (QID) | ORAL | Status: DC | PRN

## 2022-01-18 MED ORDER — COCONUT OIL OIL: 1.0000 | TOPICAL_OIL | Status: DC | PRN

## 2022-01-18 MED ORDER — SIMETHICONE 80 MG PO CHEW: 80.0000 mg | CHEWABLE_TABLET | ORAL | Status: DC | PRN

## 2022-01-18 NOTE — Lactation Note (Signed)
This note was copied from a baby's chart. Lactation Consultation Note  Patient Name: Taylor [name] XYBFX'O Date: 01/18/2022 Reason for consult: Follow-up assessment;Primapara;1st time breastfeeding;Term;Breastfeeding assistance Age:19 hours  P1, Term, Infant Female  Infant at 19 hours.  LC entered the room and baby was in the bassinet asleep.  Per the birth parent breastfeeding is going well.  The birth parent says that baby last ate at 70.  She states that she has had some pain with the initial latch, but it gets better as the feeding continues.  The birth parent says that she switched baby to the other side since she had only been feeding on one side and does not feel any pain.  LC reviewed cluster feeding and the importance of a good latch.  The parents had no further questions or concerns.  Birth parent will call RN/LC for breastfeeding support.    Interventions Interventions: Breast feeding basics reviewed  Discharge    Consult Status Consult Status: Follow-up Date: 01/19/22 Follow-up type: In-patient    Lysbeth Penner 01/18/2022, 8:58 PM

## 2022-01-18 NOTE — Discharge Summary (Addendum)
Postpartum Discharge Summary      Patient Name: Taylor [name] DOB: 2001/03/06 MRN: [id number]  Date of admission: 01/17/2022 Delivery date:01/18/2022  Delivering provider: Gerlene Fee  Date of discharge: 01/20/22  Admitting diagnosis: [redacted] weeks gestation of pregnancy [Z3A.40] Intrauterine pregnancy: [redacted]w[redacted]d    Secondary diagnosis:  Principal Problem:   [redacted] weeks gestation of pregnancy Active Problems:   Preeclampsia  Additional problems: n/a    Discharge diagnosis: Term Pregnancy Delivered                                              Post partum procedures: n/a Augmentation: AROM, Pitocin, and IP Foley Complications: Prolonged second stage of labor  Hospital course: Induction of Labor With Vaginal Delivery   21y.o. yo G1P1001 at 44w1das admitted to the hospital 01/17/2022 for induction of labor.  Indication for induction: Postdates.  Patient had an uncomplicated labor course as follows: Membrane Rupture Time/Date: 11:24 AM ,01/17/2022   Delivery Method:Vaginal, Spontaneous  Episiotomy: None  Lacerations:  2nd degree;Perineal  Details of delivery can be found in separate delivery note.  Patient had a routine postpartum course. Patient is discharged home 01/19/22.  Newborn Data: Birth date:01/18/2022  Birth time:1:52 AM  Gender:Female  Living status:Living  Apgars:9 ,9  Weight:3440 g   Magnesium Sulfate received: No BMZ received: No Rhophylac:No MMR:No T-DaP:Given prenatally Flu: N/A Transfusion:No  Physical exam  Patient Vitals for the past 24 hrs:  BP Temp Temp src Pulse Resp SpO2  01/20/22 1251 135/87 -- -- 72 -- --  01/20/22 0521 117/63 98 F (36.7 C) Oral 68 18 100 %  01/20/22 0247 111/76 -- -- -- -- --  01/19/22 2237 119/82 -- -- -- -- --  01/19/22 2124 110/79 98.2 F (36.8 C) Oral 93 18 --  01/19/22 1940 (!) 140/84 -- -- -- -- --  01/19/22 1920 137/87 -- -- 80 -- --  01/19/22 1631 114/68 98.6 F (37 C) Oral 93 16 --    General: alert,  cooperative, and no distress Lochia: appropriate Uterine Fundus: firm Incision: N/A DVT Evaluation: No evidence of DVT seen on physical exam. Labs: Lab Results  Component Value Date   WBC 20.1 (H) 01/17/2022   HGB 9.5 (L) 01/17/2022   HCT 28.7 (L) 01/17/2022   MCV 75.1 (L) 01/17/2022   PLT 361 01/17/2022      Latest Ref Rng & Units 01/17/2022    4:48 AM  CMP  Glucose 70 - 99 mg/dL 88   BUN 6 - 20 mg/dL 7   Creatinine 0.44 - 1.00 mg/dL 0.68   Sodium 135 - 145 mmol/L 135   Potassium 3.5 - 5.1 mmol/L 3.3   Chloride 98 - 111 mmol/L 108   CO2 22 - 32 mmol/L 21   Calcium 8.9 - 10.3 mg/dL 8.5   Total Protein 6.5 - 8.1 g/dL 6.0   Total Bilirubin 0.3 - 1.2 mg/dL 0.4   Alkaline Phos 38 - 126 U/L 224   AST 15 - 41 U/L 20   ALT 0 - 44 U/L 10    Edinburgh Score:     No data to display           After visit meds:  Allergies as of 01/20/2022   No Known Allergies      Medication List     STOP taking  these medications    aspirin EC 81 MG tablet       TAKE these medications    acetaminophen 325 MG tablet Commonly known as: Tylenol Take 2 tablets (650 mg total) by mouth every 4 (four) hours as needed (for pain scale < 4).   ferrous sulfate 324 (65 Fe) MG Tbec Take 1 tablet (325 mg total) by mouth every other day.   furosemide 20 MG tablet Commonly known as: LASIX Take 1 tablet (20 mg total) by mouth daily. Start taking on: January 21, 2022   ibuprofen 600 MG tablet Commonly known as: ADVIL Take 1 tablet (600 mg total) by mouth every 6 (six) hours as needed.   NIFEdipine 30 MG 24 hr tablet Commonly known as: ADALAT CC Take 1 tablet (30 mg total) by mouth daily. Start taking on: January 21, 2022   prenatal multivitamin Tabs tablet Take 1 tablet by mouth daily at 12 noon.          Discharge home in stable condition Infant Feeding: Breast Infant Disposition:home with mother Discharge instruction: per After Visit Summary and Postpartum  booklet. Activity: Advance as tolerated. Pelvic rest for 6 weeks.  Diet: routine diet Future Appointments: Future Appointments  Date Time Provider [place]  02/28/2022  9:15 AM Megan Salon, MD [facility]   Follow up Visit:  Message sent to [facility] by Autry-Lott on 01/19/2022  Please schedule this patient for a In person postpartum visit in 4 weeks with the following provider: Any provider. Additional Postpartum F/U: Laceration repair   Low risk pregnancy complicated by:  n/a Delivery mode:  Vaginal, Spontaneous  Anticipated Birth Control:  Nexplanon or POPs   01/18/2022 Simone Autry-Lott, DO    Lasix and Procardia ordered for DC. Pt originally declined but continued to have elevated BPs therefore was amendable to taking. Julianne Handler, CNM  01/20/2022 1:22 PM

## 2022-01-18 NOTE — Anesthesia Postprocedure Evaluation (Signed)
Anesthesia Post Note  Patient: Taylor [name]  Procedure(s) Performed: AN AD [place]     Patient location during evaluation: Mother Baby Anesthesia Type: Epidural Level of consciousness: awake and alert Pain management: pain level controlled Vital Signs Assessment: post-procedure vital signs reviewed and stable Respiratory status: spontaneous breathing, nonlabored ventilation and respiratory function stable Cardiovascular status: stable Postop Assessment: no headache, no backache and epidural receding Anesthetic complications: no   No notable events documented.  Last Vitals:  Vitals:   01/18/22 0604 01/18/22 1521  BP: 120/83 119/71  Pulse: 82 92  Resp: 18 18  Temp: 36.9 C 36.6 C  SpO2: 100% 100%    Last Pain:  Vitals:   01/18/22 1521  TempSrc: Oral  PainSc:    Pain Goal: Patients Stated Pain Goal: 0 (01/17/22 0554)                 Ailene Ards

## 2022-01-18 NOTE — Lactation Note (Signed)
This note was copied from a baby's chart. Lactation Consultation Note  Patient Name: Boy Veria Stradley JIRCV'E Date: 01/18/2022 Reason for consult: Initial assessment;Primapara;Term Age:19 hours Baby didn't latch in L&D. Baby has been sleepy. LC woke baby and stimulated. Clear watery mouth w/bulb syring. Placed baby to the breast in football hold.  Baby latched well and BF well. Baby had wide open flange.  Newborn behavior, STS, feeding habits, I&O, supply and demand reviewed. Mom encouraged to feed baby 8-12 times/24 hours and with feeding cues.   Answered mom's questions. Heard swallows. Praised mom. Encouraged to call for questions or assistance.  Maternal Data Has patient been taught Hand Expression?: Yes Does the patient have breastfeeding experience prior to this delivery?: No  Feeding    LATCH Score Latch: Grasps breast easily, tongue down, lips flanged, rhythmical sucking.  Audible Swallowing: A few with stimulation  Type of Nipple: Everted at rest and after stimulation  Comfort (Breast/Nipple): Soft / non-tender  Hold (Positioning): Assistance needed to correctly position infant at breast and maintain latch.  LATCH Score: 8   Lactation Tools Discussed/Used    Interventions Interventions: Breast feeding basics reviewed;Assisted with latch;Skin to skin;Breast massage;Hand express;Breast compression;Adjust position;Support pillows;Position options;LC Services brochure  Discharge    Consult Status Consult Status: Follow-up Date: 01/19/22 Follow-up type: In-patient    Hermenia Fritcher, Elta [place] 01/18/2022, 5:50 AM

## 2022-01-19 DIAGNOSIS — Z3A40 40 weeks gestation of pregnancy: Secondary | ICD-10-CM | POA: Diagnosis not present

## 2022-01-19 DIAGNOSIS — O9902 Anemia complicating childbirth: Secondary | ICD-10-CM | POA: Diagnosis not present

## 2022-01-19 MED ORDER — FUROSEMIDE 20 MG PO TABS
20.0000 mg | ORAL_TABLET | Freq: Every day | ORAL | Status: DC
  Administered 2022-01-20: 20 mg via ORAL
  Filled 2022-01-19 (×2): qty 1

## 2022-01-19 MED ORDER — NIFEDIPINE ER OSMOTIC RELEASE 30 MG PO TB24
30.0000 mg | ORAL_TABLET | Freq: Every day | ORAL | Status: DC
Start: 2022-01-19 — End: 2022-01-20
  Administered 2022-01-20: 30 mg via ORAL
  Filled 2022-01-19 (×2): qty 1

## 2022-01-19 NOTE — Progress Notes (Signed)
Patient blood pressures have returned to normal and patient states she feels better and would like to hold off on taking blood pressure meds at this time, told patient we would recheck vital signs and reevaluated in approximately 3 hours. Patient agreeable to plan of care.

## 2022-01-19 NOTE — Progress Notes (Signed)
Patient reports that she began having slightly blurred vision in her Left eye around 1700 and then began to be blurry in her Right eye as well around 1800; bp checked by RN and was 137/84 which was higher than her bp had previously been.  MD notified; came to bedside.

## 2022-01-19 NOTE — Progress Notes (Signed)
Called by nursing staff regarding patient's blood pressures.  Reports that her blood pressures have been slowly creeping up throughout the day and now in the 130s/80s.  She is also complaining of blurry vision which started approximately 5 PM.  I go to evaluate the patient reports that her blurry vision comes and goes.  Denies any headaches abdominal pain, swelling in her lower extremities.  She is otherwise well-appearing.  Repeat blood pressure 140/84.  Will initiate Lasix 20 mg daily as well as Procardia 30 mg daily.  Do not feel that the blurry vision is related to preeclampsia but will continue to monitor and have low threshold to initiate mag if needed.

## 2022-01-19 NOTE — Progress Notes (Addendum)
POSTPARTUM PROGRESS NOTE  Post Partum Day #1  Subjective:  Taylor [name] is a 21 y.o. G1P1001 s/p SVD at [redacted]w[redacted]d  She reports she is doing well. No acute events overnight. She denies any problems with ambulating, voiding or po intake. Denies nausea or vomiting.  Pain is well controlled.  Lochia is appropriate.  Objective: Blood pressure 115/73, pulse 85, temperature 97.6 F (36.4 C), temperature source Oral, resp. rate 18, last menstrual period 04/18/2021, SpO2 99 %, unknown if currently breastfeeding.  Physical Exam:  General: alert, cooperative and no distress Chest: no respiratory distress Heart:regular rate, distal pulses intact Abdomen: soft, nontender,  Uterine Fundus: firm, appropriately tender DVT Evaluation: No calf swelling or tenderness Extremities: No edema Skin: warm, dry  Recent Labs    01/17/22 1337 01/18/22 0333  HGB 9.5* 8.6*  HCT 28.7* 26.4*    Assessment/Plan: Taylor RMeline Russawis a 21y.o. G1P1001 s/p SVD at 424w1d PPD#1 - Doing well Routine postpartum care Patient has remained normotensive since delivery.  Contraception: POP's vs Nexplanon but wants to wait three months Feeding: Breast Dispo: Plan for discharge 01/20/22.   LOS: 2 days   CoApolonio SchneidersMD Resident Physician 01/19/2022, 6:05 AM   GME ATTESTATION:  I saw and evaluated the patient. I agree with the findings and the plan of care as documented in the resident's note. I have made changes to documentation as necessary.  SiGerlene FeeDO OB Fellow, [facility]/30/2023, 7:20 AM

## 2022-01-20 MED ORDER — NIFEDIPINE ER 30 MG PO TB24: 30.0000 mg | ORAL_TABLET | Freq: Every day | ORAL | 1 refills | Status: DC

## 2022-01-20 MED ORDER — ACETAMINOPHEN 325 MG PO TABS: 650.0000 mg | ORAL_TABLET | ORAL | 0 refills | Status: DC | PRN

## 2022-01-20 MED ORDER — IBUPROFEN 600 MG PO TABS: 600.0000 mg | ORAL_TABLET | Freq: Four times a day (QID) | ORAL | 0 refills | Status: DC | PRN

## 2022-01-20 MED ORDER — FUROSEMIDE 20 MG PO TABS: 20.0000 mg | ORAL_TABLET | Freq: Every day | ORAL | 0 refills | Status: DC

## 2022-01-20 NOTE — Lactation Note (Signed)
This note was copied from a baby's chart. Lactation Consultation Note  Patient Name: Boy Ivanna Kocak NIDPO'E Date: 01/20/2022 Reason for consult: Follow-up assessment;Infant weight loss;Primapara;1st time breastfeeding (7 % weight loss, per Birth parent mill feels like its coming in and hearing more swallows. LC reviewed and updated the doc flow sheets per parents.) Age:19 hours LC reviewed BF D/C teaching and mom aware of the [facility] resources.  Maternal Data Has patient been taught Hand Expression?: Yes  Feeding Mother's Current Feeding Choice: Breast Milk  LATCH Score- Last latch score 9     Lactation Tools Discussed/Used Tools: Flanges;Shells;Pump (shells  due to areola edema) Flange Size: 24;27;Other (comment) (per Birth parent, the #58 F feels snug, #27 F more comfortable) Breast pump type: Manual Pump Education: Milk Storage;Setup, frequency, and cleaning  Interventions    Discharge Discharge Education: Engorgement and breast care;Warning signs for feeding baby;Other (comment) (birth parent and support aware of [place] resources) Pump: DEBP;Personal  Consult Status Consult Status: Complete Date: 01/20/22    Myer Haff 01/20/2022, 12:00 PM

## 2022-01-21 ENCOUNTER — Encounter (HOSPITAL_BASED_OUTPATIENT_CLINIC_OR_DEPARTMENT_OTHER): Payer: Medicaid Other | Admitting: Obstetrics & Gynecology

## 2022-01-22 ENCOUNTER — Inpatient Hospital Stay (HOSPITAL_COMMUNITY): Payer: Medicaid Other

## 2022-01-22 ENCOUNTER — Inpatient Hospital Stay (HOSPITAL_COMMUNITY): Admission: AD | Admit: 2022-01-22 | Payer: Medicaid Other | Source: Home / Self Care | Admitting: Family Medicine

## 2022-01-26 ENCOUNTER — Telehealth (HOSPITAL_COMMUNITY): Payer: Self-pay | Admitting: *Deleted

## 2022-01-26 NOTE — Telephone Encounter (Signed)
Left phone voicemail message.  Odis Hollingshead, RN 01-26-2022 at 3:14pm

## 2022-02-12 ENCOUNTER — Other Ambulatory Visit: Payer: Self-pay | Admitting: Certified Nurse Midwife

## 2022-02-14 ENCOUNTER — Ambulatory Visit: Payer: Medicaid Other | Admitting: Family

## 2022-02-25 ENCOUNTER — Encounter (HOSPITAL_BASED_OUTPATIENT_CLINIC_OR_DEPARTMENT_OTHER): Payer: Self-pay | Admitting: Obstetrics & Gynecology

## 2022-02-28 ENCOUNTER — Ambulatory Visit (HOSPITAL_BASED_OUTPATIENT_CLINIC_OR_DEPARTMENT_OTHER): Payer: Self-pay | Admitting: Obstetrics & Gynecology

## 2022-03-01 ENCOUNTER — Ambulatory Visit (HOSPITAL_BASED_OUTPATIENT_CLINIC_OR_DEPARTMENT_OTHER): Payer: Medicaid Other | Admitting: Obstetrics & Gynecology

## 2022-03-03 ENCOUNTER — Ambulatory Visit (INDEPENDENT_AMBULATORY_CARE_PROVIDER_SITE_OTHER): Payer: Medicaid Other | Admitting: Obstetrics & Gynecology

## 2022-03-03 ENCOUNTER — Encounter (HOSPITAL_BASED_OUTPATIENT_CLINIC_OR_DEPARTMENT_OTHER): Payer: Self-pay | Admitting: Obstetrics & Gynecology

## 2022-03-03 VITALS — BP 127/85 | HR 79 | Ht 60.0 in | Wt 174.8 lb

## 2022-03-03 DIAGNOSIS — O1493 Unspecified pre-eclampsia, third trimester: Secondary | ICD-10-CM

## 2022-03-03 NOTE — Progress Notes (Signed)
    [facility] Partum Visit Note  Taylor [name] is a 21 y.o. G31P1001 female who presents for a postpartum visit. She is 6 weeks postpartum following a normal spontaneous vaginal delivery.  I have fully reviewed the prenatal and intrapartum course. The delivery was at 39 1/7 gestational weeks.  Anesthesia: epidural. Postpartum course has been good. Baby is doing well.  Baby is feeding by breast. Bleeding no bleeding. Bowel function is normal. Bladder function is normal. Patient is not sexually active. Contraception method is Nexplanon. Postpartum depression screening: negative.   The pregnancy intention screening data noted above was reviewed. Potential methods of contraception were discussed. The patient elected to proceed with No data recorded.    Health Maintenance Due  Topic Date Due   COVID-19 Vaccine (1) Never done   INFLUENZA VACCINE  12/21/2021    The following portions of the patient's history were reviewed and updated as appropriate: allergies, current medications, past family history, past medical history, past social history, past surgical history, and problem list.  Review of Systems A comprehensive review of systems was negative.  Objective:  BP 127/85   Pulse 79   Ht 5' (1.524 m)   Wt 174 lb 12.8 oz (79.3 kg)   LMP 04/18/2021   Breastfeeding Yes   BMI 34.14 kg/m    General:  alert and no distress   Breasts:  normal  Lungs: clear to auscultation bilaterally  Heart:  regular rate and rhythm, S1, S2 normal, no murmur, click, rub or gallop  Abdomen: soft, non-tender; bowel sounds normal; no masses,  no organomegaly   Wound well approximated incision  GU exam:  normal       Assessment:   Normal postpartum exam   Plan:   Essential components of care per ACOG recommendations:  1.  Mood and well being: Patient with negative depression screening today. Reviewed local resources for support.  - Patient tobacco use? No.   - hx of drug use? No.    2. Infant  care and feeding:  -Patient currently breastmilk feeding? Yes. Reviewed importance of draining breast regularly to support lactation.  -Social determinants of health (SDOH) reviewed in EPIC. No concerns.    3. Sexuality, contraception and birth spacing - Patient does not want a pregnancy in the next year.  Desired family size is 2-3 children.  - Reviewed reproductive life planning. Reviewed contraceptive methods based on pt preferences and effectiveness.  Patient desired Hormonal Implant today.   - Discussed birth spacing of 18 months  4. Sleep and fatigue -Encouraged family/partner/community support of 4 hrs of uninterrupted sleep to help with mood and fatigue  5. Physical Recovery  - Discussed patients delivery and complications. She describes her labor as mixed. - Patient had a Vaginal, no problems at delivery. Patient had a 2nd degree laceration. Perineal healing reviewed. Patient expressed understanding - Patient has urinary incontinence? No. - Patient is safe to resume physical and sexual activity  6.  Health Maintenance - HM due items addressed Yes - Last pap smear has not been done due to age.  Not done today.  -Breast Cancer screening indicated? No.   7. Chronic Disease/Pregnancy Condition follow up: cardiology referral discussed due to h/o preeclampsia with pregnancy.   Pt stopped blood pressure medication about a week ago.  Blood pressures have been good.    Megan Salon, MD Center for [employer], [place]

## 2022-04-07 NOTE — Progress Notes (Deleted)
Cardio-Obstetrics Clinic  New Evaluation  Date:  04/07/2022   ID:  Taylor [name], DOB [birth year], MRN [id number]  PCP:  Alma Friendly, [place] Providers Cardiologist:  None  Electrophysiologist:  None      Referring MD: Alma Friendly, MD   Chief Complaint: Pre-eclampsia  History of Present Illness:    Taylor [name] is a 21 y.o. female [[id number]] who is being seen today for the evaluation of pre-eclamspia at the request of Alma Friendly, MD.   Today, ***   Prior CV Studies Reviewed: The following studies were reviewed today: ***  Past Medical History:  Diagnosis Date   History of acute pancreatitis 05/2020   due to gallstone pancreatitis   Migraines    Mild preeclampsia    Obesity    Spina bifida occulta    MRI showed spinal epidural lipomatosis    Past Surgical History:  Procedure Laterality Date   CHOLECYSTECTOMY N/A 06/12/2020   Procedure: LAPAROSCOPIC CHOLECYSTECTOMY WITH INTRAOPERATIVE CHOLANGIOGRAM;  Surgeon: Greer Pickerel, MD;  Location: [facility];  Service: General;  Laterality: N/A;   { Click here to update PMH, PSH, OB Hx then refresh note  :1}   OB History     Gravida  1   Para  1   Term  1   Preterm      AB      Living  1      SAB      IAB      Ectopic      Multiple  0   Live Births  1           { Click here to update OB Charting then refresh note  :1}    Current Medications: No outpatient medications have been marked as taking for the 04/08/22 encounter (Appointment) with Freada Bergeron, MD.     Allergies:   Patient has no known allergies.   Social History   Socioeconomic History   Marital status: Single    Spouse name: Not on file   Number of children: Not on file   Years of education: Not on file   Highest education level: Not on file  Occupational History   Not on file  Tobacco Use   Smoking status: Never   Smokeless tobacco: Never  Vaping Use   Vaping Use:  Never used  Substance and Sexual Activity   Alcohol use: Never   Drug use: Never   Sexual activity: Not Currently    Birth control/protection: None  Other Topics Concern   Not on file  Social History Narrative   Bess lives at home with mom, dad and siblings. 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 She enjoys reading, writing and singing   Social Determinants of Health   Financial Resource Strain: Low Risk  (06/28/2021)   Overall Financial Resource Strain (CARDIA)    Difficulty of Paying Living Expenses: Not hard at all  Food Insecurity: No Food Insecurity (06/28/2021)   Hunger Vital Sign    Worried About Running Out of Food in the Last Year: Never true    [place] in the Last Year: Never true  Transportation Needs: No Transportation Needs (06/28/2021)   PRAPARE - Hydrologist (Medical): No    Lack of Transportation (Non-Medical): No  Physical Activity: Sufficiently Active (06/28/2021)   Exercise Vital Sign    Days of Exercise per Week: 5 days  Minutes of Exercise per Session: 30 min  Stress: No Stress Concern Present (06/28/2021)   [place]    Feeling of Stress : Only a little  Social Connections: Unknown (06/28/2021)   Social Connection and Isolation Panel [NHANES]    Frequency of Communication with Friends and Family: More than three times a week    Frequency of Social Gatherings with Friends and Family: Three times a week    Attends Religious Services: More than 4 times per year    Active Member of Clubs or Organizations: No    Attends Archivist Meetings: Never    Marital Status: Patient refused  { Click here to update SDOH then refresh :1}    Family History  Problem Relation Age of Onset   Anxiety disorder Mother    Asthma Sister    Hypertension Maternal Aunt    Cancer Maternal Uncle    Migraines Neg Hx    Seizures Neg Hx    Autism Neg Hx     Depression Neg Hx    ADD / ADHD Neg Hx    Bipolar disorder Neg Hx    Schizophrenia Neg Hx    Birth defects Neg Hx    Diabetes Neg Hx    Heart disease Neg Hx    Stroke Neg Hx    { Click here to update FH then refresh note    :1}   ROS:   Please see the history of present illness.    *** All other systems reviewed and are negative.   Labs/EKG Reviewed:    EKG:   EKG is *** ordered today.  The ekg ordered today demonstrates ***  Recent Labs: 01/17/2022: ALT 10; BUN 7; Creatinine, Ser 0.68; Potassium 3.3; Sodium 135 01/18/2022: Hemoglobin 8.6; Platelets 328   Recent Lipid Panel Lab Results  Component Value Date/Time   CHOL 172 (H) 06/11/2020 08:16 AM   TRIG 125 06/11/2020 08:16 AM   HDL 38 (L) 06/11/2020 08:16 AM   CHOLHDL 4.5 06/11/2020 08:16 AM   LDLCALC 109 (H) 06/11/2020 08:16 AM    Physical Exam:    VS:  There were no vitals taken for this visit.    Wt Readings from Last 3 Encounters:  03/03/22 174 lb 12.8 oz (79.3 kg) (93 %, Z= 1.50)*  01/15/22 199 lb 11.2 oz (90.6 kg) (97 %, Z= 1.94)*  01/10/22 197 lb 12.8 oz (89.7 kg) (97 %, Z= 1.91)*   * Growth percentiles are based on CDC (Girls, 2-20 Years) data.     GEN: *** Well nourished, well developed in no acute distress HEENT: Normal NECK: No JVD; No carotid bruits LYMPHATICS: No lymphadenopathy CARDIAC: ***RRR, no murmurs, rubs, gallops RESPIRATORY:  Clear to auscultation without rales, wheezing or rhonchi  ABDOMEN: Soft, non-tender, non-distended MUSCULOSKELETAL:  No edema; No deformity  SKIN: Warm and dry NEUROLOGIC:  Alert and oriented x 3 PSYCHIATRIC:  Normal affect    Risk Assessment/Risk Calculators:   { Click to calculate CARPREG II - THEN refresh note :1}    { Click to caclulate Mod WHO Class of CV Risk - THEN refresh note :1}     { Click for [id number] Score - THEN Refresh Note    :[id number]}      ASSESSMENT & PLAN:    #Pre-Eclampsia: Developed at *** There are no Patient Instructions  on file for this visit.   Dispo:  No follow-ups on file.   Medication Adjustments/Labs and Tests  Ordered: Current medicines are reviewed at length with the patient today.  Concerns regarding medicines are outlined above.  Tests Ordered: No orders of the defined types were placed in this encounter.  Medication Changes: No orders of the defined types were placed in this encounter.

## 2022-04-08 ENCOUNTER — Ambulatory Visit: Payer: Medicaid Other | Admitting: Cardiology

## 2022-04-21 ENCOUNTER — Encounter (HOSPITAL_BASED_OUTPATIENT_CLINIC_OR_DEPARTMENT_OTHER): Payer: Self-pay | Admitting: Obstetrics & Gynecology

## 2022-04-28 NOTE — Progress Notes (Deleted)
Cardio-Obstetrics Clinic  New Evaluation  Date:  04/28/2022   ID:  Taylor [name], DOB 09/21/2000, MRN [id number]  PCP:  Alma Friendly, [place] Providers Cardiologist:  None  Electrophysiologist:  None      Referring MD: Alma Friendly, MD   Chief Complaint: Pre-eclampsia  History of Present Illness:    Taylor [name] is a 21 y.o. female [[id number]] who is being seen today for the evaluation of pre-eclamspia at the request of Alma Friendly, MD.   Patient was admitted in 12/2021 for induction of labor. Course complicated by pre-eclampsia. She was recommended for antihypertensives at discharge but these have since been stopped.  Today, ***   Prior CV Studies Reviewed: The following studies were reviewed today: ***  Past Medical History:  Diagnosis Date   History of acute pancreatitis 05/2020   due to gallstone pancreatitis   Migraines    Mild preeclampsia    Obesity    Spina bifida occulta    MRI showed spinal epidural lipomatosis    Past Surgical History:  Procedure Laterality Date   CHOLECYSTECTOMY N/A 06/12/2020   Procedure: LAPAROSCOPIC CHOLECYSTECTOMY WITH INTRAOPERATIVE CHOLANGIOGRAM;  Surgeon: Greer Pickerel, MD;  Location: [facility];  Service: General;  Laterality: N/A;   { Click here to update PMH, PSH, OB Hx then refresh note  :1}   OB History     Gravida  1   Para  1   Term  1   Preterm      AB      Living  1      SAB      IAB      Ectopic      Multiple  0   Live Births  1           { Click here to update OB Charting then refresh note  :1}    Current Medications: No outpatient medications have been marked as taking for the 04/29/22 encounter (Appointment) with Freada Bergeron, MD.     Allergies:   Patient has no known allergies.   Social History   Socioeconomic History   Marital status: Single    Spouse name: Not on file   Number of children: Not on file   Years of education: Not  on file   Highest education level: Not on file  Occupational History   Not on file  Tobacco Use   Smoking status: Never   Smokeless tobacco: Never  Vaping Use   Vaping Use: Never used  Substance and Sexual Activity   Alcohol use: Never   Drug use: Never   Sexual activity: Not Currently    Birth control/protection: None  Other Topics Concern   Not on file  Social History Narrative   Taylor [name] lives at home with mom, dad and siblings. 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 She enjoys reading, writing and singing   Social Determinants of Health   Financial Resource Strain: Low Risk  (06/28/2021)   Overall Financial Resource Strain (CARDIA)    Difficulty of Paying Living Expenses: Not hard at all  Food Insecurity: No Food Insecurity (06/28/2021)   Hunger Vital Sign    Worried About Running Out of Food in the Last Year: Never true    [place] in the Last Year: Never true  Transportation Needs: No Transportation Needs (06/28/2021)   PRAPARE - Hydrologist (Medical): No  Lack of Transportation (Non-Medical): No  Physical Activity: Sufficiently Active (06/28/2021)   Exercise Vital Sign    Days of Exercise per Week: 5 days    Minutes of Exercise per Session: 30 min  Stress: No Stress Concern Present (06/28/2021)   [place]    Feeling of Stress : Only a little  Social Connections: Unknown (06/28/2021)   Social Connection and Isolation Panel [NHANES]    Frequency of Communication with Friends and Family: More than three times a week    Frequency of Social Gatherings with Friends and Family: Three times a week    Attends Religious Services: More than 4 times per year    Active Member of Clubs or Organizations: No    Attends Archivist Meetings: Never    Marital Status: Patient refused  { Click here to update SDOH then refresh :1}    Family History  Problem  Relation Age of Onset   Anxiety disorder Mother    Asthma Sister    Hypertension Maternal Aunt    Cancer Maternal Uncle    Migraines Neg Hx    Seizures Neg Hx    Autism Neg Hx    Depression Neg Hx    ADD / ADHD Neg Hx    Bipolar disorder Neg Hx    Schizophrenia Neg Hx    Birth defects Neg Hx    Diabetes Neg Hx    Heart disease Neg Hx    Stroke Neg Hx    { Click here to update FH then refresh note    :1}   ROS:   Please see the history of present illness.    *** All other systems reviewed and are negative.   Labs/EKG Reviewed:    EKG:   EKG is *** ordered today.  The ekg ordered today demonstrates ***  Recent Labs: 01/17/2022: ALT 10; BUN 7; Creatinine, Ser 0.68; Potassium 3.3; Sodium 135 01/18/2022: Hemoglobin 8.6; Platelets 328   Recent Lipid Panel Lab Results  Component Value Date/Time   CHOL 172 (H) 06/11/2020 08:16 AM   TRIG 125 06/11/2020 08:16 AM   HDL 38 (L) 06/11/2020 08:16 AM   CHOLHDL 4.5 06/11/2020 08:16 AM   LDLCALC 109 (H) 06/11/2020 08:16 AM    Physical Exam:    VS:  There were no vitals taken for this visit.    Wt Readings from Last 3 Encounters:  03/03/22 174 lb 12.8 oz (79.3 kg) (93 %, Z= 1.50)*  01/15/22 199 lb 11.2 oz (90.6 kg) (97 %, Z= 1.94)*  01/10/22 197 lb 12.8 oz (89.7 kg) (97 %, Z= 1.91)*   * Growth percentiles are based on CDC (Girls, 2-20 Years) data.     GEN: *** Well nourished, well developed in no acute distress HEENT: Normal NECK: No JVD; No carotid bruits LYMPHATICS: No lymphadenopathy CARDIAC: ***RRR, no murmurs, rubs, gallops RESPIRATORY:  Clear to auscultation without rales, wheezing or rhonchi  ABDOMEN: Soft, non-tender, non-distended MUSCULOSKELETAL:  No edema; No deformity  SKIN: Warm and dry NEUROLOGIC:  Alert and oriented x 3 PSYCHIATRIC:  Normal affect    Risk Assessment/Risk Calculators:   { Click to calculate CARPREG II - THEN refresh note :1}    { Click to caclulate Mod WHO Class of CV Risk - THEN  refresh note :1}     { Click for [id number] Score - THEN Refresh Note    :[id number]}      ASSESSMENT & PLAN:    #  Pre-Eclampsia: Developed at 40w gestation and was placed on antihypertensives with significant improvement. Has since been weaned off BP meds and blood pressure is well controlled. Discussed that she is high risk for recurrence of pre-eclampsia with subsequent pregnancies as well as chronic HTN and CV disease later in life. Will continue with screening and prevention. If she is to become pregnant in the future, will follow at [facility] at that time.   There are no Patient Instructions on file for this visit.   Dispo:  No follow-ups on file.   Medication Adjustments/Labs and Tests Ordered: Current medicines are reviewed at length with the patient today.  Concerns regarding medicines are outlined above.  Tests Ordered: No orders of the defined types were placed in this encounter.  Medication Changes: No orders of the defined types were placed in this encounter.

## 2022-04-29 ENCOUNTER — Encounter: Payer: Self-pay | Admitting: Cardiology

## 2022-04-29 ENCOUNTER — Ambulatory Visit (INDEPENDENT_AMBULATORY_CARE_PROVIDER_SITE_OTHER): Payer: Medicaid Other | Admitting: Cardiology

## 2022-04-29 VITALS — BP 111/72 | HR 85 | Ht 60.0 in | Wt 177.0 lb

## 2022-04-29 DIAGNOSIS — O1492 Unspecified pre-eclampsia, second trimester: Secondary | ICD-10-CM

## 2022-04-29 DIAGNOSIS — Z8759 Personal history of other complications of pregnancy, childbirth and the puerperium: Secondary | ICD-10-CM | POA: Diagnosis not present

## 2022-04-29 DIAGNOSIS — R002 Palpitations: Secondary | ICD-10-CM | POA: Diagnosis not present

## 2022-04-29 NOTE — Progress Notes (Signed)
Cardio-Obstetrics Clinic  New Evaluation  Date:  04/29/2022   ID:  Taylor [name], DOB 2000-10-12, MRN [id number]  PCP:  Alma Friendly, [place] Providers Cardiologist:  None  Electrophysiologist:  None      Referring MD: Alma Friendly, MD   Chief Complaint: Pre-eclampsia  History of Present Illness:    Taylor [name] is a 21 y.o. female [[id number]] who is being seen today for the evaluation of pre-eclamspia at the request of Alma Friendly, MD.   Patient was admitted in 12/2021 for induction of labor. Course complicated by pre-eclampsia. She was recommended for antihypertensives at discharge but these have since been stopped.  Today, she feels well.  Postpartum course has been smooth for her.  She is breast-feeding. Sleeping well.  Her only concern is palpitations that she has been having at rest. States her heart rate will jump up to 130 while sitting. Can last up to 10 min.  This is happening less than or once a week.  Palpitations are not very bothersome to her and overall decreasing in frequency. She declined cardiac monitor for now but is willing to pursue one if symptoms fail to improve.  Checks her blood pressure at home sometimes, and typically gets readings in the 120s/70s-80s.  Denies any headaches, chest pain, shortness of breath, peripheral swelling or edema, dyspnea on exertion.   Prior CV Studies Reviewed: The following studies were reviewed today:   Past Medical History:  Diagnosis Date   History of acute pancreatitis 05/2020   due to gallstone pancreatitis   Migraines    Mild preeclampsia    Obesity    Spina bifida occulta    MRI showed spinal epidural lipomatosis    Past Surgical History:  Procedure Laterality Date   CHOLECYSTECTOMY N/A 06/12/2020   Procedure: LAPAROSCOPIC CHOLECYSTECTOMY WITH INTRAOPERATIVE CHOLANGIOGRAM;  Surgeon: Greer Pickerel, MD;  Location: [facility];  Service: General;  Laterality: N/A;       OB History     Gravida  1   Para  1   Term  1   Preterm      AB      Living  1      SAB      IAB      Ectopic      Multiple  0   Live Births  1               Current Medications: Current Meds  Medication Sig   Prenatal Vit-Fe Fumarate-FA (PRENATAL MULTIVITAMIN) TABS tablet Take 1 tablet by mouth daily at 12 noon.     Allergies:   Patient has no known allergies.   Social History   Socioeconomic History   Marital status: Single    Spouse name: Not on file   Number of children: Not on file   Years of education: Not on file   Highest education level: Not on file  Occupational History   Not on file  Tobacco Use   Smoking status: Never   Smokeless tobacco: Never  Vaping Use   Vaping Use: Never used  Substance and Sexual Activity   Alcohol use: Never   Drug use: Never   Sexual activity: Not Currently    Birth control/protection: None  Other Topics Concern   Not on file  Social History Narrative   [name] lives at home with mom, dad and siblings. 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 She enjoys reading, writing and  singing   Social Determinants of Health   Financial Resource Strain: Low Risk  (06/28/2021)   Overall Financial Resource Strain (CARDIA)    Difficulty of Paying Living Expenses: Not hard at all  Food Insecurity: No Food Insecurity (06/28/2021)   Hunger Vital Sign    Worried About Running Out of Food in the Last Year: Never true    Ran Out of Food in the Last Year: Never true  Transportation Needs: No Transportation Needs (06/28/2021)   PRAPARE - Hydrologist (Medical): No    Lack of Transportation (Non-Medical): No  Physical Activity: Sufficiently Active (06/28/2021)   Exercise Vital Sign    Days of Exercise per Week: 5 days    Minutes of Exercise per Session: 30 min  Stress: No Stress Concern Present (06/28/2021)   [place]     Feeling of Stress : Only a little  Social Connections: Unknown (06/28/2021)   Social Connection and Isolation Panel [NHANES]    Frequency of Communication with Friends and Family: More than three times a week    Frequency of Social Gatherings with Friends and Family: Three times a week    Attends Religious Services: More than 4 times per year    Active Member of Clubs or Organizations: No    Attends Archivist Meetings: Never    Marital Status: Patient refused      Family History  Problem Relation Age of Onset   Anxiety disorder Mother    Asthma Sister    Hypertension Maternal Aunt    Cancer Maternal Uncle    Migraines Neg Hx    Seizures Neg Hx    Autism Neg Hx    Depression Neg Hx    ADD / ADHD Neg Hx    Bipolar disorder Neg Hx    Schizophrenia Neg Hx    Birth defects Neg Hx    Diabetes Neg Hx    Heart disease Neg Hx    Stroke Neg Hx       ROS:   Please see the history of present illness.     All other systems reviewed and are negative.   Labs/EKG Reviewed:    EKG:   EKG is  ordered today.  The ekg ordered today demonstrates NSR with HR 80  Recent Labs: 01/17/2022: ALT 10; BUN 7; Creatinine, Ser 0.68; Potassium 3.3; Sodium 135 01/18/2022: Hemoglobin 8.6; Platelets 328   Recent Lipid Panel Lab Results  Component Value Date/Time   CHOL 172 (H) 06/11/2020 08:16 AM   TRIG 125 06/11/2020 08:16 AM   HDL 38 (L) 06/11/2020 08:16 AM   CHOLHDL 4.5 06/11/2020 08:16 AM   LDLCALC 109 (H) 06/11/2020 08:16 AM    Physical Exam:    VS:  BP 111/72   Pulse 85   Ht 5' (1.524 m)   Wt 177 lb (80.3 kg)   LMP 04/22/2022 (Approximate)   SpO2 100%   Breastfeeding Yes   BMI 34.57 kg/m     Wt Readings from Last 3 Encounters:  04/29/22 177 lb (80.3 kg) (94 %, Z= 1.54)*  03/03/22 174 lb 12.8 oz (79.3 kg) (93 %, Z= 1.50)*  01/15/22 199 lb 11.2 oz (90.6 kg) (97 %, Z= 1.94)*   * Growth percentiles are based on CDC (Girls, 2-20 Years) data.     GEN:  Well nourished,  well developed in no acute distress HEENT: Normal NECK: No JVD; No carotid bruits CARDIAC:  RRR, no murmurs, rubs, gallops RESPIRATORY:  Clear to auscultation without rales, wheezing or rhonchi  ABDOMEN: Soft, non-tender, non-distended MUSCULOSKELETAL:  No edema; No deformity  SKIN: Warm and dry NEUROLOGIC:  Alert and oriented x 3 PSYCHIATRIC:  Normal affect    Risk Assessment/Risk Calculators:     ASSESSMENT & PLAN:    #Pre-Eclampsia: Developed at 40w gestation and was placed on antihypertensives with significant improvement. Has since been weaned off BP meds and blood pressure is well controlled. Discussed that she is high risk for recurrence of pre-eclampsia with subsequent pregnancies as well as chronic HTN and CV disease later in life. Will continue with screening and prevention. If she is to become pregnant in the future, will follow at [facility] clinic at that time.   #Palpitations Intermittent for the past 3 months in the postpartum course.  Largely asymptomatic and likely secondary to hormonal changes and possible dehydration with breast feeding. She declined cardiac monitor for now but may pursue in future if symptoms fail to improve or worsen. -Encouraged plenty of p.o. hydration, electrolyte supplementation  -Discussed working up further with event monitor versus cardiac mobile/Apple watch.  She prefers to observe at this time, will follow-up in 3 months and see if this is worsening.   Patient Instructions  Medication Instructions:   Your physician recommends that you continue on your current medications as directed. Please refer to the Current Medication list given to you today.  *If you need a refill on your cardiac medications before your next appointment, please call your pharmacy*    Follow-Up:  3 MONTHS WITH DR. Johney Frame HERE AT [place]         Dispo:  No follow-ups on file.   Medication Adjustments/Labs  and Tests Ordered: Current medicines are reviewed at length with the patient today.  Concerns regarding medicines are outlined above.  Tests Ordered: Orders Placed This Encounter  Procedures   EKG 12-Lead   Medication Changes: No orders of the defined types were placed in this encounter.

## 2022-04-29 NOTE — Patient Instructions (Signed)
Medication Instructions:   Your physician recommends that you continue on your current medications as directed. Please refer to the Current Medication list given to you today.  *If you need a refill on your cardiac medications before your next appointment, please call your pharmacy*    Follow-Up:  3 MONTHS WITH DR. Johney Frame HERE AT [facility]

## 2022-05-11 ENCOUNTER — Ambulatory Visit (HOSPITAL_BASED_OUTPATIENT_CLINIC_OR_DEPARTMENT_OTHER): Payer: Medicaid Other | Admitting: Obstetrics & Gynecology

## 2022-05-26 ENCOUNTER — Ambulatory Visit: Payer: Medicaid Other | Admitting: Nurse Practitioner

## 2022-06-14 ENCOUNTER — Ambulatory Visit (INDEPENDENT_AMBULATORY_CARE_PROVIDER_SITE_OTHER): Payer: Medicaid Other | Admitting: Obstetrics & Gynecology

## 2022-06-14 ENCOUNTER — Encounter (HOSPITAL_BASED_OUTPATIENT_CLINIC_OR_DEPARTMENT_OTHER): Payer: Self-pay | Admitting: Obstetrics & Gynecology

## 2022-06-14 VITALS — BP 121/71 | HR 85 | Ht 60.0 in | Wt 186.0 lb

## 2022-06-14 DIAGNOSIS — Z30017 Encounter for initial prescription of implantable subdermal contraceptive: Secondary | ICD-10-CM | POA: Diagnosis not present

## 2022-06-14 MED ORDER — ETONOGESTREL 68 MG ~~LOC~~ IMPL
68.0000 mg | DRUG_IMPLANT | Freq: Once | SUBCUTANEOUS | Status: AC
  Administered 2022-06-14: 68 mg via SUBCUTANEOUS

## 2022-06-14 NOTE — Progress Notes (Signed)
22 y.o. [id number] Hispanic Single female presents for Nexplanon insertion.  She has been counseled about alternative types of contraception and has decided this long acting method is the best for her.  Procedure, risks and benefits have all been explained.  She has the following questions today:  none.     LMP:  06/12/2022  UPT:  negative today  After all questions were answered, consent was obtained.    Past Medical History:  Diagnosis Date   History of acute pancreatitis 05/2020   due to gallstone pancreatitis   Migraines    Mild preeclampsia    Obesity    Spina bifida occulta    MRI showed spinal epidural lipomatosis    Past Surgical History:  Procedure Laterality Date   CHOLECYSTECTOMY N/A 06/12/2020   Procedure: LAPAROSCOPIC CHOLECYSTECTOMY WITH INTRAOPERATIVE CHOLANGIOGRAM;  Surgeon: Greer Pickerel, MD;  Location: [facility];  Service: General;  Laterality: N/A;    Current Outpatient Medications on File Prior to Visit  Medication Sig Dispense Refill   Multiple Vitamin (MULTIVITAMIN) tablet Take 1 tablet by mouth daily.     No current facility-administered medications on file prior to visit.   No Known Allergies  Vitals:   06/14/22 0912  BP: 121/71  Pulse: 85   Physical Exam Constitutional:      Appearance: Normal appearance.  Neurological:     General: No focal deficit present.     Mental Status: She is alert.  Psychiatric:        Mood and Affect: Mood normal.     Procedure: Patient placed supine on exam table with her left arm flexed at the elbow and externally rotated. The location for insertion site was identified on the inner side of lower arm.  Area cleansed with Betadine x 3 and draped in normal sterile fashion.  Insertion site and path of insertion was anesthetized with 1% Lidocaine without epinephrine, 3cc total.  Usine Nexplanon device (and after confirming present of rod in device), skin was pierced and then elevated along insertion path, passing device just  under the skin.  Rod released and device removed.  Rod palpated easily.  Small amount of bleeding made hemostatic with pressure.  Steri-strips applied and a pressure bandage was place over the site.  Entire procedure performed with sterile technique.  Lot:  [id number]  Expiration: 03/17/2024  Assessment/Plan: 1. Nexplanon insertion - instruction for care of insertion site discussed.  Advised to expect bruising and to call with any concerns. - removal at 3 years for contraception discussed - information card given to pt  2. Encounter for initial prescription of implantable subdermal contraceptive

## 2022-06-14 NOTE — Addendum Note (Signed)
Addended by: Blenda Nicely on: 06/14/2022 10:14 AM   Modules accepted: Orders

## 2022-06-16 ENCOUNTER — Ambulatory Visit: Payer: Medicaid Other | Admitting: Nurse Practitioner

## 2022-07-14 ENCOUNTER — Encounter (HOSPITAL_BASED_OUTPATIENT_CLINIC_OR_DEPARTMENT_OTHER): Payer: Self-pay | Admitting: Obstetrics & Gynecology

## 2022-07-18 ENCOUNTER — Ambulatory Visit (INDEPENDENT_AMBULATORY_CARE_PROVIDER_SITE_OTHER): Payer: Medicaid Other | Admitting: Pediatrics

## 2022-07-18 DIAGNOSIS — Z23 Encounter for immunization: Secondary | ICD-10-CM | POA: Diagnosis not present

## 2022-07-27 NOTE — Progress Notes (Signed)
[facility]  Follow-up Evaluation  Date:  07/29/2022   ID:  Aquisha Wilborne, DOB 12-Mar-2001, MRN [id number]  PCP:  Alma Friendly, [place] Providers Cardiologist:  None  Electrophysiologist:  None      Referring MD: Alma Friendly, MD   Chief Complaint: Pre-eclampsia  History of Present Illness:    Dayanne Elfering is a 22 y.o. female [id number] who is being seen today for the evaluation of pre-eclamspia at the request of Alma Friendly, MD.   Patient was admitted in 12/2021 for induction of labor. Course complicated by pre-eclampsia. She was recommended for antihypertensives at discharge but these have since been stopped.  Was last seen in clinic 04/2022 where she was having intermittent palpitations but they were improving. Declined cardiac monitor at that time.   Today, the patient overall feels okay. Continues to have intermittent palpitations as well as occasional burry vision. Thinks it may be related to stress with being with school. Remains hydrated and not skipping meals. Blood pressure is well controlled. No chest pain, SOB, lightheadedness, or dizziness. No LE edema or significant dyspnea on exertion. BP is well controlled today and is not on medications.    Prior CV Studies Reviewed: The following studies were reviewed today:   Past Medical History:  Diagnosis Date   History of acute pancreatitis 05/2020   due to gallstone pancreatitis   Migraines    Mild preeclampsia    Obesity    Spina bifida occulta    MRI showed spinal epidural lipomatosis    Past Surgical History:  Procedure Laterality Date   CHOLECYSTECTOMY N/A 06/12/2020   Procedure: LAPAROSCOPIC CHOLECYSTECTOMY WITH INTRAOPERATIVE CHOLANGIOGRAM;  Surgeon: Greer Pickerel, MD;  Location: [place];  Service: General;  Laterality: N/A;      OB History     Gravida  1   Para  1   Term  1   Preterm      AB      Living  1      SAB      IAB       Ectopic      Multiple  0   Live Births  1               Current Medications: Current Meds  Medication Sig   Multiple Vitamin (MULTIVITAMIN) tablet Take 1 tablet by mouth daily.     Allergies:   Patient has no known allergies.   Social History   Socioeconomic History   Marital status: Single    Spouse name: Not on file   Number of children: Not on file   Years of education: Not on file   Highest education level: Not on file  Occupational History   Not on file  Tobacco Use   Smoking status: Never   Smokeless tobacco: Never  Vaping Use   Vaping Use: Never used  Substance and Sexual Activity   Alcohol use: Never   Drug use: Never   Sexual activity: Not Currently    Birth control/protection: None  Other Topics Concern   Not on file  Social History Narrative   Brittini lives at home with mom, dad and siblings. 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 She enjoys reading, writing and singing   Social Determinants of Health   Financial Resource Strain: Low Risk  (06/28/2021)   Overall Financial Resource Strain (CARDIA)    Difficulty of Paying Living Expenses: Not hard at all  Food Insecurity: No Food Insecurity (06/28/2021)   Hunger Vital Sign    Worried About Running Out of Food in the Last Year: Never true    Ran Out of Food in the Last Year: Never true  Transportation Needs: No Transportation Needs (06/28/2021)   PRAPARE - Hydrologist (Medical): No    Lack of Transportation (Non-Medical): No  Physical Activity: Sufficiently Active (06/28/2021)   Exercise Vital Sign    Days of Exercise per Week: 5 days    Minutes of Exercise per Session: 30 min  Stress: No Stress Concern Present (06/28/2021)   [place]    Feeling of Stress : Only a little  Social Connections: Unknown (06/28/2021)   Social Connection and Isolation Panel [NHANES]    Frequency of Communication with  Friends and Family: More than three times a week    Frequency of Social Gatherings with Friends and Family: Three times a week    Attends Religious Services: More than 4 times per year    Active Member of Clubs or Organizations: No    Attends Archivist Meetings: Never    Marital Status: Patient refused      Family History  Problem Relation Age of Onset   Anxiety disorder Mother    Asthma Sister    Hypertension Maternal Aunt    Cancer Maternal Uncle    Migraines Neg Hx    Seizures Neg Hx    Autism Neg Hx    Depression Neg Hx    ADD / ADHD Neg Hx    Bipolar disorder Neg Hx    Schizophrenia Neg Hx    Birth defects Neg Hx    Diabetes Neg Hx    Heart disease Neg Hx    Stroke Neg Hx       ROS:   Please see the history of present illness.     All other systems reviewed and are negative.   Labs/EKG Reviewed:    EKG:   EKG is  ordered today.  The ekg ordered today demonstrates NSR 85  Recent Labs: 01/17/2022: ALT 10; BUN 7; Creatinine, Ser 0.68; Potassium 3.3; Sodium 135 01/18/2022: Hemoglobin 8.6; Platelets 328   Recent Lipid Panel Lab Results  Component Value Date/Time   CHOL 172 (H) 06/11/2020 08:16 AM   TRIG 125 06/11/2020 08:16 AM   HDL 38 (L) 06/11/2020 08:16 AM   CHOLHDL 4.5 06/11/2020 08:16 AM   LDLCALC 109 (H) 06/11/2020 08:16 AM    Physical Exam:    VS:  BP 105/70   Pulse 85   Ht '5\' 5"'$  (1.651 m)   Wt 188 lb (85.3 kg)   SpO2 99%   BMI 31.28 kg/m     Wt Readings from Last 3 Encounters:  07/29/22 188 lb (85.3 kg) (96 %, Z= 1.74)*  06/14/22 186 lb (84.4 kg) (96 %, Z= 1.71)*  04/29/22 177 lb (80.3 kg) (94 %, Z= 1.54)*   * Growth percentiles are based on CDC (Girls, 2-20 Years) data.     GEN:  Well nourished, well developed in no acute distress HEENT: Normal NECK: No JVD; No carotid bruits CARDIAC: RRR, no murmurs RESPIRATORY:  CTAB, no wheezes ABDOMEN: Soft, non-tender, non-distended MUSCULOSKELETAL:  No edema; No deformity  SKIN:  Warm and dry NEUROLOGIC:  Alert and oriented x 3 PSYCHIATRIC:  Normal affect    Risk Assessment/Risk Calculators:     ASSESSMENT & PLAN:    #  Palpitations Has been ongoing since prior visit with several episodes per week. Also now with occasional vision changes. Will check zio monitor for further evaluation as well as TSH and Hgb as was previously anemic which may be contributing. She is already working on hydration and is not having significant caffeine. -Check 7 day zio -Check TSH and Hgb -Encouraged plenty of p.o. hydration, electrolyte supplementation  -Discussed working up further with event monitor versus cardiac mobile/Apple watch.  She prefers to observe at this time, will follow-up in 3 months and see if this is worsening.  #Pre-Eclampsia: Developed at 40w gestation and was placed on antihypertensives with significant improvement. Has since been weaned off BP meds and blood pressure is well controlled. Discussed that she is high risk for recurrence of pre-eclampsia with subsequent pregnancies as well as chronic HTN and CV disease later in life. Will continue with screening and prevention. If she is to become pregnant in the future, will follow at [facility] [place] clinic at that time.   #Anemia: Last HgB 8.6 in 12/2021 while pregnant. Not currently on iron. -Recheck HgB for monitoring  Patient Instructions  Medication Instructions:   Your physician recommends that you continue on your current medications as directed. Please refer to the Current Medication list given to you today.  *If you need a refill on your cardiac medications before your next appointment, please call your pharmacy*   Lab Work:  NEXT [place] --CHECK TSH AND CBC W DIFF  If you have labs (blood work) drawn today and your tests are completely normal, you will receive your results only by: [place] (if you have MyChart) OR A paper copy in the mail If you have any lab test that  is abnormal or we need to change your treatment, we will call you to review the results.   Testing/Procedures:  [facility] Gulling- Long Term Monitor Instructions  Your physician has requested you wear a ZIO patch monitor for 7 days.  This is a single patch monitor. [employer] supplies one patch monitor per enrollment. Additional stickers are not available. Please do not apply patch if you will be having a Nuclear Stress Test,  Echocardiogram, Cardiac CT, MRI, or Chest Xray during the period you would be wearing the  monitor. The patch cannot be worn during these tests. You cannot remove and re-apply the  ZIO XT patch monitor.  Your ZIO patch monitor will be mailed 3 day USPS to your address on file. It may take 3-5 days  to receive your monitor after you have been enrolled.  Once you have received your monitor, please review the enclosed instructions. Your monitor  has already been registered assigning a specific monitor serial # to you.  Billing and Patient Assistance Program Information  We have supplied [employer] with any of your insurance information on file for billing purposes. [employer] offers a sliding scale Patient Assistance Program for patients that do not have  insurance, or whose insurance does not completely cover the cost of the ZIO monitor.  You must apply for the Patient Assistance Program to qualify for this discounted rate.  To apply, please call [employer] at [phone], select option 4, select option 2, ask to apply for  Patient Assistance Program. Theodore Demark will ask your household income, and how many people  are in your household. They will quote your out-of-pocket cost based on that information.  [employer] will also be able to set up a 12-month interest-free payment plan if needed.  Applying  the monitor   Shave hair from upper left chest.  Hold abrader disc by orange tab. Rub abrader in 40 strokes over the upper left chest as  indicated in your monitor instructions.  Clean area  with 4 enclosed alcohol pads. Let dry.  Apply patch as indicated in monitor instructions. Patch will be placed under collarbone on left  side of chest with arrow pointing upward.  Rub patch adhesive wings for 2 minutes. Remove white label marked "1". Remove the white  label marked "2". Rub patch adhesive wings for 2 additional minutes.  While looking in a mirror, press and release button in center of patch. A small green light will  flash 3-4 times. This will be your only indicator that the monitor has been turned on.  Do not shower for the first 24 hours. You may shower after the first 24 hours.  Press the button if you feel a symptom. You will hear a small click. Record Date, Time and  Symptom in the Patient Logbook.  When you are ready to remove the patch, follow instructions on the last 2 pages of Patient  Logbook. Stick patch monitor onto the last page of Patient Logbook.  Place Patient Logbook in the blue and white box. Use locking tab on box and tape box closed  securely. The blue and white box has prepaid postage on it. Please place it in the mailbox as  soon as possible. Your physician should have your test results approximately 7 days after the  monitor has been mailed back to [facility].  Call [place] at [phone] if you have questions regarding  your ZIO XT patch monitor. Call them immediately if you see an orange light blinking on your  monitor.  If your monitor falls off in less than 4 days, contact our Monitor department at [phone].  If your monitor becomes loose or falls off after 4 days call [employer] at [phone] for  suggestions on securing your monitor    Follow-Up:  3 MONTHS WITH DR. Johney Frame HERE AT [place]:  No follow-ups on file.   Medication Adjustments/Labs and Tests Ordered: Current medicines are reviewed at length with the patient today.  Concerns regarding medicines are outlined above.  Tests  Ordered: Orders Placed This Encounter  Procedures   TSH   CBC w/Diff   LONG TERM MONITOR (3-14 DAYS)   Medication Changes: No orders of the defined types were placed in this encounter.

## 2022-07-29 ENCOUNTER — Encounter: Payer: Self-pay | Admitting: Cardiology

## 2022-07-29 ENCOUNTER — Telehealth: Payer: Self-pay | Admitting: *Deleted

## 2022-07-29 ENCOUNTER — Ambulatory Visit (INDEPENDENT_AMBULATORY_CARE_PROVIDER_SITE_OTHER): Payer: Medicaid Other | Admitting: Cardiology

## 2022-07-29 ENCOUNTER — Ambulatory Visit: Payer: Medicaid Other

## 2022-07-29 ENCOUNTER — Other Ambulatory Visit (HOSPITAL_COMMUNITY): Payer: Self-pay | Admitting: Family Medicine

## 2022-07-29 ENCOUNTER — Ambulatory Visit
Admission: RE | Admit: 2022-07-29 | Discharge: 2022-07-29 | Disposition: A | Discharge status: Home / Self Care | Payer: Medicaid Other | Source: Ambulatory Visit | Attending: Family Medicine | Admitting: Family Medicine

## 2022-07-29 VITALS — BP 105/70 | HR 85 | Ht 65.0 in | Wt 188.0 lb

## 2022-07-29 DIAGNOSIS — R7612 Nonspecific reaction to cell mediated immunity measurement of gamma interferon antigen response without active tuberculosis: Secondary | ICD-10-CM

## 2022-07-29 DIAGNOSIS — O1492 Unspecified pre-eclampsia, second trimester: Secondary | ICD-10-CM

## 2022-07-29 DIAGNOSIS — Z8759 Personal history of other complications of pregnancy, childbirth and the puerperium: Secondary | ICD-10-CM

## 2022-07-29 DIAGNOSIS — R002 Palpitations: Secondary | ICD-10-CM | POA: Diagnosis not present

## 2022-07-29 DIAGNOSIS — Z79899 Other long term (current) drug therapy: Secondary | ICD-10-CM

## 2022-07-29 NOTE — Patient Instructions (Signed)
Medication Instructions:   Your physician recommends that you continue on your current medications as directed. Please refer to the Current Medication list given to you today.  *If you need a refill on your cardiac medications before your next appointment, please call your pharmacy*   Lab Work:  NEXT [place] --CHECK TSH AND CBC W DIFF  If you have labs (blood work) drawn today and your tests are completely normal, you will receive your results only by: [place] (if you have MyChart) OR A paper copy in the mail If you have any lab test that is abnormal or we need to change your treatment, we will call you to review the results.   Testing/Procedures:  Bryn Gulling- Long Term Monitor Instructions  Your physician has requested you wear a ZIO patch monitor for 7 days.  This is a single patch monitor. [employer] supplies one patch monitor per enrollment. Additional stickers are not available. Please do not apply patch if you will be having a Nuclear Stress Test,  Echocardiogram, Cardiac CT, MRI, or Chest Xray during the period you would be wearing the  monitor. The patch cannot be worn during these tests. You cannot remove and re-apply the  ZIO XT patch monitor.  Your ZIO patch monitor will be mailed 3 day USPS to your address on file. It may take 3-5 days  to receive your monitor after you have been enrolled.  Once you have received your monitor, please review the enclosed instructions. Your monitor  has already been registered assigning a specific monitor serial # to you.  Billing and Patient Assistance Program Information  We have supplied [employer] with any of your insurance information on file for billing purposes. [employer] offers a sliding scale Patient Assistance Program for patients that do not have  insurance, or whose insurance does not completely cover the cost of the ZIO monitor.  You must apply for the Patient Assistance Program to qualify for this  discounted rate.  To apply, please call [employer] at [phone], select option 4, select option 2, ask to apply for  Patient Assistance Program. Theodore Demark will ask your household income, and how many people  are in your household. They will quote your out-of-pocket cost based on that information.  [employer] will also be able to set up a 23-month interest-free payment plan if needed.  Applying the monitor   Shave hair from upper left chest.  Hold abrader disc by orange tab. Rub abrader in 40 strokes over the upper left chest as  indicated in your monitor instructions.  Clean area with 4 enclosed alcohol pads. Let dry.  Apply patch as indicated in monitor instructions. Patch will be placed under collarbone on left  side of chest with arrow pointing upward.  Rub patch adhesive wings for 2 minutes. Remove white label marked "1". Remove the white  label marked "2". Rub patch adhesive wings for 2 additional minutes.  While looking in a mirror, press and release button in center of patch. A small green light will  flash 3-4 times. This will be your only indicator that the monitor has been turned on.  Do not shower for the first 24 hours. You may shower after the first 24 hours.  Press the button if you feel a symptom. You will hear a small click. Record Date, Time and  Symptom in the Patient Logbook.  When you are ready to remove the patch, follow instructions on the last 2 pages of Patient  Logbook. Stick patch monitor onto the last page of Patient Logbook.  Place Patient Logbook in the blue and white box. Use locking tab on box and tape box closed  securely. The blue and white box has prepaid postage on it. Please place it in the mailbox as  soon as possible. Your physician should have your test results approximately 7 days after the  monitor has been mailed back to [facility].  Call [place] at [phone] if you have questions regarding  your ZIO XT patch monitor. Call  them immediately if you see an orange light blinking on your  monitor.  If your monitor falls off in less than 4 days, contact our Monitor department at [phone].  If your monitor becomes loose or falls off after 4 days call [employer] at [phone] for  suggestions on securing your monitor    Follow-Up:  3 MONTHS WITH DR. Johney Frame HERE AT [facility]

## 2022-07-29 NOTE — Progress Notes (Unsigned)
Enrolled patient for a 7 day Zio XT monitor to be mailed to patients home.  

## 2022-07-29 NOTE — Telephone Encounter (Signed)
-----   Message from Gerarda Gunther sent at 07/29/2022  4:32 PM EST ----- Regarding: RE: 7 DAY ZIO PER DR. Johney Frame done ----- Message ----- From: Nuala Alpha, LPN Sent: 06/25/3005   4:15 PM EST To: Jennefer Bravo; Katrina Cloyd Stagers Subject: 7 DAY ZIO PER DR. Johney Frame                    Dr. Johney Frame ordered a 7 day zio on this pt for palpitations This is a cardio OB pt  Please enroll and let me know when you do?  Thanks [employer]

## 2022-08-01 ENCOUNTER — Other Ambulatory Visit (INDEPENDENT_AMBULATORY_CARE_PROVIDER_SITE_OTHER): Payer: Medicaid Other

## 2022-08-01 DIAGNOSIS — I5031 Acute diastolic (congestive) heart failure: Secondary | ICD-10-CM

## 2022-08-05 ENCOUNTER — Encounter: Payer: Self-pay | Admitting: Cardiology

## 2022-08-05 ENCOUNTER — Ambulatory Visit: Payer: Medicaid Other | Attending: Cardiology

## 2022-08-05 DIAGNOSIS — R002 Palpitations: Secondary | ICD-10-CM | POA: Diagnosis not present

## 2022-08-05 DIAGNOSIS — Z79899 Other long term (current) drug therapy: Secondary | ICD-10-CM

## 2022-08-05 DIAGNOSIS — O1492 Unspecified pre-eclampsia, second trimester: Secondary | ICD-10-CM

## 2022-08-06 LAB — CBC WITH DIFFERENTIAL/PLATELET
Basophils Absolute: 0.1 10*3/uL (ref 0.0–0.2)
Basos: 1 %
EOS (ABSOLUTE): 0.2 10*3/uL (ref 0.0–0.4)
Eos: 3 %
Hematocrit: 37.4 % (ref 34.0–46.6)
Hemoglobin: 11.9 g/dL (ref 11.1–15.9)
Immature Grans (Abs): 0 10*3/uL (ref 0.0–0.1)
Immature Granulocytes: 0 %
Lymphocytes Absolute: 2.5 10*3/uL (ref 0.7–3.1)
Lymphs: 29 %
MCH: 24.8 pg — ABNORMAL LOW (ref 26.6–33.0)
MCHC: 31.8 g/dL (ref 31.5–35.7)
MCV: 78 fL — ABNORMAL LOW (ref 79–97)
Monocytes Absolute: 0.4 10*3/uL (ref 0.1–0.9)
Monocytes: 5 %
Neutrophils Absolute: 5.3 10*3/uL (ref 1.4–7.0)
Neutrophils: 62 %
Platelets: 477 10*3/uL — ABNORMAL HIGH (ref 150–450)
RBC: 4.79 x10E6/uL (ref 3.77–5.28)
RDW: 14 % (ref 11.7–15.4)
WBC: 8.6 10*3/uL (ref 3.4–10.8)

## 2022-08-06 LAB — TSH: TSH: 1.29 u[IU]/mL (ref 0.450–4.500)

## 2022-08-10 ENCOUNTER — Ambulatory Visit: Payer: Medicaid Other | Attending: Cardiology

## 2022-08-10 ENCOUNTER — Other Ambulatory Visit: Payer: Self-pay | Admitting: *Deleted

## 2022-08-10 ENCOUNTER — Encounter: Payer: Self-pay | Admitting: *Deleted

## 2022-08-10 DIAGNOSIS — R002 Palpitations: Secondary | ICD-10-CM

## 2022-08-10 NOTE — Progress Notes (Unsigned)
[employer] long term monitor to be shipped to patients address on file. Sensitive skin set up requested.

## 2022-08-10 NOTE — Progress Notes (Signed)
Patient enrolled for [employer] to ship a 7 day long term holter monitor to her address on file.  Sensitive skin electrodes requested.

## 2022-08-12 DIAGNOSIS — R7612 Nonspecific reaction to cell mediated immunity measurement of gamma interferon antigen response without active tuberculosis: Secondary | ICD-10-CM | POA: Diagnosis not present

## 2022-08-12 DIAGNOSIS — Z111 Encounter for screening for respiratory tuberculosis: Secondary | ICD-10-CM | POA: Diagnosis not present

## 2022-08-17 ENCOUNTER — Encounter (HOSPITAL_BASED_OUTPATIENT_CLINIC_OR_DEPARTMENT_OTHER): Payer: Self-pay | Admitting: Obstetrics & Gynecology

## 2022-08-17 ENCOUNTER — Other Ambulatory Visit (HOSPITAL_BASED_OUTPATIENT_CLINIC_OR_DEPARTMENT_OTHER): Payer: Self-pay | Admitting: Obstetrics & Gynecology

## 2022-08-17 DIAGNOSIS — N926 Irregular menstruation, unspecified: Secondary | ICD-10-CM

## 2022-08-17 MED ORDER — NORETHINDRONE 0.35 MG PO TABS: 1.0000 | ORAL_TABLET | Freq: Every day | ORAL | 1 refills | Status: DC

## 2022-09-01 ENCOUNTER — Ambulatory Visit: Payer: Medicaid Other | Admitting: Nurse Practitioner

## 2022-09-02 DIAGNOSIS — R7612 Nonspecific reaction to cell mediated immunity measurement of gamma interferon antigen response without active tuberculosis: Secondary | ICD-10-CM | POA: Diagnosis not present

## 2022-09-05 ENCOUNTER — Other Ambulatory Visit: Payer: Self-pay | Admitting: Pediatrics

## 2022-09-05 MED ORDER — VENTOLIN HFA 108 (90 BASE) MCG/ACT IN AERS: 2.0000 | INHALATION_SPRAY | RESPIRATORY_TRACT | 2 refills | Status: DC | PRN

## 2022-09-06 ENCOUNTER — Ambulatory Visit: Payer: Self-pay

## 2022-09-06 ENCOUNTER — Ambulatory Visit
Admission: RE | Admit: 2022-09-06 | Discharge: 2022-09-06 | Disposition: A | Discharge status: Home / Self Care | Payer: Medicaid Other | Source: Ambulatory Visit | Attending: Emergency Medicine | Admitting: Emergency Medicine

## 2022-09-06 VITALS — BP 106/78 | HR 93 | Temp 98.7°F | Resp 18

## 2022-09-06 DIAGNOSIS — J302 Other seasonal allergic rhinitis: Secondary | ICD-10-CM | POA: Insufficient documentation

## 2022-09-06 DIAGNOSIS — J029 Acute pharyngitis, unspecified: Secondary | ICD-10-CM | POA: Insufficient documentation

## 2022-09-06 DIAGNOSIS — J4521 Mild intermittent asthma with (acute) exacerbation: Secondary | ICD-10-CM | POA: Diagnosis not present

## 2022-09-06 LAB — POCT RAPID STREP A (OFFICE): Rapid Strep A Screen: NEGATIVE

## 2022-09-06 MED ORDER — AZELASTINE-FLUTICASONE 137-50 MCG/ACT NA SUSP: 1.0000 | Freq: Two times a day (BID) | NASAL | 2 refills | Status: DC

## 2022-09-06 MED ORDER — CETIRIZINE HCL 10 MG PO TABS: 10.0000 mg | ORAL_TABLET | Freq: Every day | ORAL | 1 refills | Status: AC

## 2022-09-06 MED ORDER — PREDNISONE 10 MG (21) PO TBPK: ORAL_TABLET | ORAL | 0 refills | Status: AC

## 2022-09-06 NOTE — ED Triage Notes (Signed)
Pt reports cough, wheezing, nasal congestion x 3 days; sore throat x 2 weeks. Nyquil gives some relief.

## 2022-09-06 NOTE — ED Provider Notes (Signed)
[facility]    CSN: [id number] Arrival date & time: 09/06/22  1351    HISTORY   Chief Complaint  Patient presents with   Wheezing    Entered by patient   Appointment    1400   HPI Taylor [name] is a pleasant, 22 y.o. female who presents to urgent care today. Pt reports persistent, nonproductive cough, wheezing, nasal congestion and clear rhinorrhea x 3 days; sore, scratchy throat, pain with swallowing x 2 weeks. Nyquil gives some relief.  Denies known sick contacts.  Patient reports a history of allergies, states she alternately takes Zyrtec or Benadryl depending on how bad her symptoms are.  Patient states she also sometimes uses Flonase but does not use daily.  Patient denies shortness of breath, fever, body aches, chills.  The history is provided by the patient.   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 Authorizing Provider  albuterol (VENTOLIN HFA) 108 (90 Base) MCG/ACT inhaler Inhale 2 puffs into the lungs  every 4 (four) hours as needed for wheezing or shortness of breath. 09/05/22   Lady Deutscher, MD  Multiple Vitamin (MULTIVITAMIN) tablet Take 1 tablet by mouth daily.    [provider]  norethindrone (MICRONOR) 0.35 MG tablet Take 1 tablet (0.35 mg total) by mouth daily. 08/17/22   Jerene Bears, MD    Family History Family History  Problem Relation Age of Onset   Anxiety disorder Mother    Asthma Sister    Hypertension Maternal Aunt    Cancer Maternal Uncle    Migraines Neg Hx    Seizures Neg Hx    Autism Neg Hx    Depression Neg Hx    ADD / ADHD Neg Hx    Bipolar disorder Neg Hx    Schizophrenia Neg Hx    Birth defects Neg Hx    Diabetes Neg Hx    Heart disease Neg Hx    Stroke Neg Hx    Social History Social History   Tobacco Use   Smoking status: Never   Smokeless tobacco: Never  Vaping Use   Vaping Use: Never used  Substance Use Topics   Alcohol use: Never   Drug use: Never   Allergies   Patient has no  known allergies.  Review of Systems Review of Systems Pertinent findings revealed after performing a 14 point review of systems has been noted in the history of present illness.  Physical Exam Vital Signs BP 106/78 (BP Location: Left Arm)   Pulse 93   Temp 98.7 F (37.1 C) (Oral)   Resp 18   SpO2 98%   No data found.  Physical Exam Vitals and nursing note reviewed.  Constitutional:      General: She is not in acute distress.    Appearance: She is well-developed. She is ill-appearing. She is not toxic-appearing.  HENT:     Head: Normocephalic and atraumatic.     Salivary Glands: Right salivary gland is diffusely enlarged and tender. Left salivary gland is diffusely enlarged and tender.     Right Ear: Hearing and external ear normal. A middle ear effusion is present. Tympanic membrane is bulging. Tympanic membrane is not injected or erythematous.     Left Ear: Hearing and external ear normal. A middle ear effusion is present. Tympanic  membrane is bulging. Tympanic membrane is not injected or erythematous.     Ears:     Comments: Bilateral EACs normal, both TMs bulging with clear fluid    Nose: Rhinorrhea present. No nasal deformity, septal deviation, signs of injury, nasal tenderness, mucosal edema or congestion. Rhinorrhea is clear.     Right Nostril: Occlusion present. No foreign body, epistaxis or septal hematoma.     Left Nostril: Occlusion present. No foreign body, epistaxis or septal hematoma.     Right Turbinates: Enlarged, swollen and pale.     Left Turbinates: Enlarged, swollen and pale.     Right Sinus: No maxillary sinus tenderness or frontal sinus tenderness.     Left Sinus: No maxillary sinus tenderness or frontal sinus tenderness.     Mouth/Throat:     Lips: Pink. No lesions.     Mouth: Mucous membranes are moist. No oral lesions or angioedema.     Dentition: No gingival swelling.     Tongue: No lesions.     Palate: No mass.     Pharynx: Uvula midline. Pharyngeal swelling and posterior oropharyngeal erythema present. No oropharyngeal exudate or uvula swelling.     Tonsils: No tonsillar exudate. 2+ on the right. 2+ on the left.     Comments: Postnasal drip Eyes:     Extraocular Movements: Extraocular movements intact.     Conjunctiva/sclera: Conjunctivae normal.     Pupils: Pupils are equal, round, and reactive to light.  Neck:     Thyroid: No thyroid mass, thyromegaly or thyroid tenderness.     Trachea: Tracheal tenderness present. No abnormal tracheal secretions or tracheal deviation.     Comments: Voice is muffled Cardiovascular:     Rate and Rhythm: Normal rate and regular rhythm.     Pulses: Normal pulses.     Heart sounds: Normal heart sounds, S1 normal and S2 normal. No murmur heard.    No friction rub. No gallop.  Pulmonary:     Effort: Pulmonary effort is normal. No accessory muscle usage, prolonged expiration, respiratory distress or retractions.     Breath sounds: No stridor, decreased air  movement or transmitted upper airway sounds. No decreased breath sounds, wheezing, rhonchi or rales.  Abdominal:     General: Bowel sounds are normal.     Palpations: Abdomen is soft.     Tenderness: There is generalized abdominal tenderness. There is no right CVA tenderness, left CVA tenderness or rebound.  Negative signs include Murphy's sign.     Hernia: No hernia is present.  Musculoskeletal:        General: No tenderness. Normal range of motion.     Cervical back: Full passive range of motion without pain, normal range of motion and neck supple.     Right lower leg: No edema.     Left lower leg: No edema.  Lymphadenopathy:     Cervical: Cervical adenopathy present.     Right cervical: Superficial cervical adenopathy present.     Left cervical: Superficial cervical adenopathy present.  Skin:    General: Skin is warm and dry.     Findings: No erythema, lesion or rash.  Neurological:     General: No focal deficit present.     Mental Status: She is alert and oriented to person, place, and time. Mental status is at baseline.  Psychiatric:        Mood and Affect: Mood normal.        Behavior: Behavior normal.        Thought Content: Thought content normal.        Judgment: Judgment normal.     Visual Acuity Right Eye Distance:   Left Eye Distance:   Bilateral Distance:    Right Eye Near:   Left Eye Near:    Bilateral Near:     UC Couse / Diagnostics / Procedures:     Radiology No results found.  Procedures Procedures (including critical care time) EKG  Pending results:  Labs Reviewed  CULTURE, GROUP A STREP [facility])  POCT RAPID STREP A (OFFICE)    Medications Ordered in UC: Medications - No data to display  UC Diagnoses / Final Clinical Impressions(s)   I have reviewed the triage vital signs and the nursing notes.  Pertinent labs & imaging results that were available during my care of the patient were reviewed by me and considered in my medical decision making  (see chart for details).    Final diagnoses:  Seasonal allergic rhinitis, unspecified trigger  Acute pharyngitis, unspecified etiology  Mild intermittent asthma with acute exacerbation  Rapid strep test today is negative, throat culture pending, will treat with antibiotics as needed.  Patient advised physical exam findings are more concerning for uncontrolled allergies.  Patient provided with tapering dose of steroids for wheezing.  Patient states she has albuterol at home.  Patient also provided with allergy medications as below.  Conservative care recommended, return precautions advised.    Please see discharge instructions below for further details of plan of care as provided to patient. ED Prescriptions     Medication Sig Dispense Auth. Provider   Azelastine-Fluticasone (DYMISTA) 137-50 MCG/ACT SUSP Place 1 spray into the nose every 12 (twelve) hours. 23 g Theadora Rama Scales, PA-C   cetirizine (ZYRTEC ALLERGY) 10 MG tablet Take 1 tablet (10 mg total) by mouth at bedtime. 90 tablet Theadora Rama Scales, PA-C   predniSONE (STERAPRED UNI-PAK 21 TAB) 10 MG (21) TBPK tablet Take 6 tablets (60 mg total) by mouth daily for 1 day, THEN 5 tablets (50 mg total) daily for 1 day, THEN 4 tablets (40 mg total) daily for 1 day, THEN 3 tablets (30 mg total) daily for 1 day, THEN 2 tablets (20 mg total) daily for 1 day, THEN 1 tablet (10 mg total) daily for 1 day. 21 tablet Theadora Rama Scales, PA-C      PDMP not reviewed this encounter.  Disposition Upon Discharge:  Condition: stable for discharge  home Home: take medications as prescribed; routine discharge instructions as discussed; follow up as advised.  Patient presented with an acute illness with associated systemic symptoms and significant discomfort requiring urgent management. In my opinion, this is a condition that a prudent lay person (someone who possesses an average knowledge of health and medicine) may potentially expect to result in  complications if not addressed urgently such as respiratory distress, impairment of bodily function or dysfunction of bodily organs.   Routine symptom specific, illness specific and/or disease specific instructions were discussed with the patient and/or caregiver at length.   As such, the patient has been evaluated and assessed, work-up was performed and treatment was provided in alignment with urgent care protocols and evidence based medicine.  Patient/parent/caregiver has been advised that the patient may require follow up for further testing and treatment if the symptoms continue in spite of treatment, as clinically indicated and appropriate.  If the patient was tested for COVID-19, Influenza and/or RSV, then the patient/parent/guardian was advised to isolate at home pending the results of his/her diagnostic coronavirus test and potentially longer if they're positive. I have also advised pt that if his/her COVID-19 test returns positive, it's recommended to self-isolate for at least 10 days after symptoms first appeared AND until fever-free for 24 hours without fever reducer AND other symptoms have improved or resolved. Discussed self-isolation recommendations as well as instructions for household member/close contacts as per the [facility] and [place] [facility], and also gave patient the COVID packet with this information.  Patient/parent/caregiver has been advised to return to the [facility] or PCP in 3-5 days if no better; to PCP or the Emergency Department if new signs and symptoms develop, or if the current signs or symptoms continue to change or worsen for further workup, evaluation and treatment as clinically indicated and appropriate  The patient will follow up with their current PCP if and as advised. If the patient does not currently have a PCP we will assist them in obtaining one.   The patient may need specialty follow up if the symptoms continue, in spite of conservative treatment and management, for further  workup, evaluation, consultation and treatment as clinically indicated and appropriate.  Patient/parent/caregiver verbalized understanding and agreement of plan as discussed.  All questions were addressed during visit.  Please see discharge instructions below for further details of plan.  Discharge Instructions:   Discharge Instructions      Your strep test today is negative.  Streptococcal throat culture will be performed per our protocol.  The result of your throat culture will be posted to your MyChart once it is complete, this typically takes 3 to 5 days.  If your streptococcal throat culture is positive, you will be contacted by phone and antibiotics will prescribed for you.    Please read below to learn more about the medications, dosages and frequencies that I recommend to help alleviate your symptoms and to get you feeling better soon:   Your symptoms and my physical exam findings are concerning for exacerbation of your underlying allergies.     To avoid catching frequent respiratory infections, having skin reactions, dealing with eye irritation, losing sleep, missing work, etc., due to uncontrolled allergies, it is important that you begin/continue your allergy regimen and are consistent with taking your meds exactly as prescribed.   Please read below to learn more about the medications, dosages and frequencies that I recommend to help alleviate your symptoms and to get you feeling better soon: Sterapred dosepak (prednisone):  This is a steroid that will significantly calm your upper and lower airways.  Please take one row of tablets daily with your breakfast meal starting tomorrow morning until the prescription is complete.      Zyrtec (cetirizine): This is an excellent second-generation antihistamine that helps to reduce respiratory inflammatory response to environmental allergens.  In some patients, this medication can cause daytime sleepiness so I recommend that you take 1 tablet  daily at bedtime.     Dymista (fluticasone and azelastine): This is a combination nasal spray that contains both a nasal steroid and nasal antihistamine.  Please use 1 spray in each nare twice daily or every 12 hours.  This medication works best when it is used regularly, not "as needed".  You may find that it takes a few days for this to reach full effectiveness, please be patient with his onboarding process.  The most common side effect of this medication is nosebleeds.  Please discontinue use for 1 week if this occurs, then resume.   ProAir, Ventolin, Proventil (albuterol): This inhaled medication contains a short acting beta agonist bronchodilator.  This medication relaxes the smooth muscle of the airway in the lungs.  When these muscles are tight, breathing becomes more constricted.  The result of relaxation of the smooth muscle is increased air movement and improved work of breathing.  This is a short acting medication that can be used every 4-6 hours as needed for increased work of breathing, shortness of breath, wheezing and excessive coughing.  It comes in the form of a handheld inhaler or nebulizer solution.  I recommended that for the next 3 to 4 days, this medication is used 4 times daily on a scheduled basis then decrease to twice daily and as needed until symptoms have completely resolved which I anticipate will be several weeks.   If symptoms have not meaningfully improved in the next 5 to 7 days, please return for repeat evaluation or follow-up with your regular provider.  If symptoms have worsened in the next 3 to 5 days, please return for repeat evaluation or follow-up with your regular provider.    Thank you for visiting urgent care today.  We appreciate the opportunity to participate in your care.       This office note has been dictated using Teaching laboratory technician.  Unfortunately, this method of dictation can sometimes lead to typographical or grammatical errors.  I  apologize for your inconvenience in advance if this occurs.  Please do not hesitate to reach out to me if clarification is needed.      Theadora Rama Scales, PA-C 09/07/22 1045

## 2022-09-06 NOTE — Discharge Instructions (Addendum)
Your strep test today is negative.  Streptococcal throat culture will be performed per our protocol.  The result of your throat culture will be posted to your MyChart once it is complete, this typically takes 3 to 5 days.  If your streptococcal throat culture is positive, you will be contacted by phone and antibiotics will prescribed for you.    Please read below to learn more about the medications, dosages and frequencies that I recommend to help alleviate your symptoms and to get you feeling better soon:   Your symptoms and my physical exam findings are concerning for exacerbation of your underlying allergies.     To avoid catching frequent respiratory infections, having skin reactions, dealing with eye irritation, losing sleep, missing work, etc., due to uncontrolled allergies, it is important that you begin/continue your allergy regimen and are consistent with taking your meds exactly as prescribed.   Please read below to learn more about the medications, dosages and frequencies that I recommend to help alleviate your symptoms and to get you feeling better soon: Sterapred dosepak (prednisone): This is a steroid that will significantly calm your upper and lower airways.  Please take one row of tablets daily with your breakfast meal starting tomorrow morning until the prescription is complete.      Zyrtec (cetirizine): This is an excellent second-generation antihistamine that helps to reduce respiratory inflammatory response to environmental allergens.  In some patients, this medication can cause daytime sleepiness so I recommend that you take 1 tablet daily at bedtime.     Dymista (fluticasone and azelastine): This is a combination nasal spray that contains both a nasal steroid and nasal antihistamine.  Please use 1 spray in each nare twice daily or every 12 hours.  This medication works best when it is used regularly, not "as needed".  You may find that it takes a few days for this to reach full  effectiveness, please be patient with his onboarding process.  The most common side effect of this medication is nosebleeds.  Please discontinue use for 1 week if this occurs, then resume.   ProAir, Ventolin, Proventil (albuterol): This inhaled medication contains a short acting beta agonist bronchodilator.  This medication relaxes the smooth muscle of the airway in the lungs.  When these muscles are tight, breathing becomes more constricted.  The result of relaxation of the smooth muscle is increased air movement and improved work of breathing.  This is a short acting medication that can be used every 4-6 hours as needed for increased work of breathing, shortness of breath, wheezing and excessive coughing.  It comes in the form of a handheld inhaler or nebulizer solution.  I recommended that for the next 3 to 4 days, this medication is used 4 times daily on a scheduled basis then decrease to twice daily and as needed until symptoms have completely resolved which I anticipate will be several weeks.   If symptoms have not meaningfully improved in the next 5 to 7 days, please return for repeat evaluation or follow-up with your regular provider.  If symptoms have worsened in the next 3 to 5 days, please return for repeat evaluation or follow-up with your regular provider.    Thank you for visiting urgent care today.  We appreciate the opportunity to participate in your care.

## 2022-09-09 LAB — CULTURE, GROUP A STREP (THRC)

## 2022-09-13 ENCOUNTER — Telehealth: Payer: Self-pay | Admitting: *Deleted

## 2022-09-13 NOTE — Telephone Encounter (Signed)
Patient had first monitor from [facility] cancelled .  A second monitor was ordered from [employer] [employer] with Sensitive Skin Electrodes requested.  Patients 7 day monitor only had a couple hours of data. Test and charges from [employer] [employer] were cancelled.

## 2022-09-22 ENCOUNTER — Encounter (HOSPITAL_BASED_OUTPATIENT_CLINIC_OR_DEPARTMENT_OTHER): Payer: Self-pay | Admitting: Obstetrics & Gynecology

## 2022-09-23 ENCOUNTER — Ambulatory Visit (INDEPENDENT_AMBULATORY_CARE_PROVIDER_SITE_OTHER): Payer: Medicaid Other | Admitting: Family Medicine

## 2022-09-23 ENCOUNTER — Encounter (HOSPITAL_BASED_OUTPATIENT_CLINIC_OR_DEPARTMENT_OTHER): Payer: Self-pay | Admitting: Obstetrics & Gynecology

## 2022-09-23 VITALS — BP 126/76 | HR 82 | Ht 64.0 in | Wt 191.0 lb

## 2022-09-23 DIAGNOSIS — N764 Abscess of vulva: Secondary | ICD-10-CM | POA: Diagnosis not present

## 2022-09-23 MED ORDER — SULFAMETHOXAZOLE-TRIMETHOPRIM 800-160 MG PO TABS: 1.0000 | ORAL_TABLET | Freq: Two times a day (BID) | ORAL | 0 refills | Status: AC

## 2022-09-23 NOTE — Progress Notes (Signed)
   Subjective:    Patient ID: Taylor [name] is a 22 y.o. female presenting with Vaginal Pain (?cyst)  on 09/23/2022  HPI: There x 2 days. Noted improved with ibuprofen. Worse yesterday. It is some better today with draining.  Review of Systems  Constitutional:  Negative for chills and fever.  Respiratory:  Negative for shortness of breath.   Cardiovascular:  Negative for chest pain.  Gastrointestinal:  Negative for abdominal pain, nausea and vomiting.  Genitourinary:  Negative for dysuria.  Skin:  Negative for rash.      Objective:    BP 126/76   Pulse 82   Ht 5\' 4"  (1.626 m)   Wt 191 lb (86.6 kg)   LMP  (Approximate)   Breastfeeding Yes   BMI 32.79 kg/m  Physical Exam Exam conducted with a chaperone present.  Constitutional:      General: She is not in acute distress.    Appearance: She is well-developed.  HENT:     Head: Normocephalic and atraumatic.  Eyes:     General: No scleral icterus. Cardiovascular:     Rate and Rhythm: Normal rate.  Pulmonary:     Effort: Pulmonary effort is normal.  Abdominal:     Palpations: Abdomen is soft.  Genitourinary:   Musculoskeletal:     Cervical back: Neck supple.  Skin:    General: Skin is warm and dry.  Neurological:     Mental Status: She is alert and oriented to person, place, and time.         Assessment & Plan:   Boil of vulva - continue warm compresses to promote draining, no shaving, 5d Abx - Plan: sulfamethoxazole-trimethoprim (BACTRIM DS) 800-160 MG tablet  Return if symptoms worsen or fail to improve.  Reva Bores, MD 09/23/2022 11:52 AM

## 2022-10-13 ENCOUNTER — Ambulatory Visit
Admission: RE | Admit: 2022-10-13 | Discharge: 2022-10-13 | Disposition: A | Discharge status: Home / Self Care | Payer: Medicaid Other | Source: Ambulatory Visit | Attending: Internal Medicine | Admitting: Internal Medicine

## 2022-10-13 VITALS — BP 114/76 | HR 82 | Temp 98.0°F | Resp 16

## 2022-10-13 DIAGNOSIS — R300 Dysuria: Secondary | ICD-10-CM | POA: Insufficient documentation

## 2022-10-13 DIAGNOSIS — N3001 Acute cystitis with hematuria: Secondary | ICD-10-CM | POA: Insufficient documentation

## 2022-10-13 LAB — POCT URINALYSIS DIP (MANUAL ENTRY)
Bilirubin, UA: NEGATIVE
Glucose, UA: NEGATIVE mg/dL
Ketones, POC UA: NEGATIVE mg/dL
Nitrite, UA: NEGATIVE
Protein Ur, POC: NEGATIVE mg/dL
Spec Grav, UA: 1.01 (ref 1.010–1.025)
Urobilinogen, UA: 0.2 E.U./dL
pH, UA: 6 (ref 5.0–8.0)

## 2022-10-13 LAB — POCT URINE PREGNANCY: Preg Test, Ur: NEGATIVE

## 2022-10-13 MED ORDER — NITROFURANTOIN MONOHYD MACRO 100 MG PO CAPS: 100.0000 mg | ORAL_CAPSULE | Freq: Two times a day (BID) | ORAL | 0 refills | Status: DC

## 2022-10-13 NOTE — ED Triage Notes (Signed)
Pt states burning with urination and foul smelling urine for the past 5 days.

## 2022-10-13 NOTE — ED Provider Notes (Addendum)
Taylor [name]    CSN: [id number] Arrival date & time: 10/13/22  1843      History   Chief Complaint Chief Complaint  Patient presents with   Dysuria    HPI Taylor [name] is a 22 y.o. female.   Patient presents with urinary burning and malodorous urine that started about 5 days ago.  Patient denies urinary frequency, vaginal discharge, abdominal pain, back pain, chills, fever, nausea, vomiting.  Patient denies any exposure to STD.  Reports unprotected intercourse.  Last menstrual cycle was in January but patient reports this is baseline as she has Nexplanon.  She reports she is currently taking amoxicillin for dental infection.  She states that she did not take any antibiotics prescribed on 5/3 for abscess to vaginal area.   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 Authorizing Provider  isoniazid (NYDRAZID) 300 MG tablet Take by mouth daily.   Yes [provider]  nitrofurantoin,  macrocrystal-monohydrate, (MACROBID) 100 MG capsule Take 1 capsule (100 mg total) by mouth 2 (two) times daily. 10/13/22  Yes Milam Allbaugh, Acie Fredrickson, FNP  albuterol (VENTOLIN HFA) 108 (90 Base) MCG/ACT inhaler Inhale 2 puffs into the lungs every 4 (four) hours as needed for wheezing or shortness of breath. 09/05/22   Lady Deutscher, MD  Azelastine-Fluticasone [facility]) 137-50 MCG/ACT SUSP Place 1 spray into the nose every 12 (twelve) hours. Patient not taking: Reported on 09/23/2022 09/06/22 10/06/22  Theadora Rama Scales, PA-C  cetirizine (ZYRTEC ALLERGY) 10 MG tablet Take 1 tablet (10 mg total) by mouth at bedtime. 09/06/22 03/05/23  Theadora Rama Scales, PA-C  Multiple Vitamin (MULTIVITAMIN) tablet Take 1 tablet by mouth daily.    [provider]    Family History Family History  Problem Relation Age of Onset   Anxiety disorder Mother    Asthma Sister    Hypertension Maternal Aunt    Cancer Maternal Uncle    Migraines Neg Hx    Seizures Neg Hx    Autism Neg Hx    Depression Neg Hx    ADD / ADHD Neg Hx    Bipolar disorder Neg Hx    Schizophrenia Neg Hx    Birth defects Neg Hx  Diabetes Neg Hx    Heart disease Neg Hx    Stroke Neg Hx     Social History Social History   Tobacco Use   Smoking status: Never   Smokeless tobacco: Never  Vaping Use   Vaping Use: Never used  Substance Use Topics   Alcohol use: Never   Drug use: Never     Allergies   Patient has no known allergies.   Review of Systems Review of Systems Per HPI  Physical Exam Triage Vital Signs ED Triage Vitals  Enc Vitals Group     BP 10/13/22 1853 114/76     Pulse Rate 10/13/22 1853 82     Resp 10/13/22 1853 16     Temp 10/13/22 1853 98 F (36.7 C)     Temp Source 10/13/22 1853 Oral     SpO2 10/13/22 1853 98 %     Weight --      Height --      Head Circumference --      Peak Flow --      Pain Score 10/13/22 1854 0     Pain Loc --      Pain Edu? --      Excl. in GC? --    No data  found.  Updated Vital Signs BP 114/76 (BP Location: Left Arm)   Pulse 82   Temp 98 F (36.7 C) (Oral)   Resp 16   LMP  (LMP Unknown)   SpO2 98%   Visual Acuity Right Eye Distance:   Left Eye Distance:   Bilateral Distance:    Right Eye Near:   Left Eye Near:    Bilateral Near:     Physical Exam Constitutional:      General: She is not in acute distress.    Appearance: Normal appearance. She is not toxic-appearing or diaphoretic.  HENT:     Head: Normocephalic and atraumatic.  Eyes:     Extraocular Movements: Extraocular movements intact.     Conjunctiva/sclera: Conjunctivae normal.  Pulmonary:     Effort: Pulmonary effort is normal.  Genitourinary:    Comments: Deferred with shared decision making. Self swab performed.  Neurological:     General: No focal deficit present.     Mental Status: She is alert and oriented to person, place, and time. Mental status is at baseline.  Psychiatric:        Mood and Affect: Mood normal.        Behavior: Behavior normal.        Thought Content: Thought content normal.        Judgment: Judgment normal.      UC Treatments / Results  Labs (all labs ordered are listed, but only abnormal results are displayed) Labs Reviewed  POCT URINALYSIS DIP (MANUAL ENTRY) - Abnormal; Notable for the following components:      Result Value   Clarity, UA hazy (*)    Blood, UA moderate (*)    Leukocytes, UA Small (1+) (*)    All other components within normal limits  URINE CULTURE  POCT URINE PREGNANCY  CERVICOVAGINAL ANCILLARY ONLY    EKG   Radiology No results found.  Procedures Procedures (including critical [name] time)  Medications Ordered in UC Medications - No data to display  Initial Impression / Assessment and Plan / UC Course  I have reviewed the triage vital signs and the nursing notes.  Pertinent labs & imaging results that were available during my [name] of the patient  were reviewed by me and considered in my medical  decision making (see chart for details).     UA showing a small amount of leukocytes and with associated symptoms, this is concerning for UTI.  Although, patient is currently taking amoxicillin so I have a higher suspicion for possible vaginal yeast infection despite absence of vaginal discharge.  Therefore, will send urine culture and cervicovaginal swab to confirm etiology.  Patient declined STD testing.  Urine pregnancy test was negative.  Patient wishes to have treatment for UTI today which I do think is reasonable.  Do not think that amoxicillin will provide adequate coverage for UTI so will prescribe Macrobid.  Macrobid should be safe to take with amoxicillin but encouraged her to take this with food.  Also advised patient that it is not safe with breast-feeding so she needs to pump and dump and patient reports that she is able to do this.  Advised her to follow-up if any symptoms persist or worsen.  Patient verbalized understanding and was agreeable with plan. Final Clinical Impressions(s) / UC Diagnoses   Final diagnoses:  Dysuria  Acute cystitis with hematuria     Discharge Instructions      Urine culture and vaginal swab are pending.  I have sent you an antibiotic that may treat urinary tract infection.  This is not safe with breast-feeding so please pump and dump and supplement otherwise.    ED Prescriptions     Medication Sig Dispense Auth. Provider   nitrofurantoin, macrocrystal-monohydrate, (MACROBID) 100 MG capsule Take 1 capsule (100 mg total) by mouth 2 (two) times daily. 10 capsule Gustavus Bryant, [place]      PDMP not reviewed this encounter.   Gustavus Bryant, [place] 10/13/22 1934    Gustavus Bryant, [place] 10/13/22 1934    Gustavus Bryant, [place] 10/13/22 [phone]

## 2022-10-13 NOTE — Discharge Instructions (Signed)
Urine culture and vaginal swab are pending.  I have sent you an antibiotic that may treat urinary tract infection.  This is not safe with breast-feeding so please pump and dump and supplement otherwise.

## 2022-10-14 LAB — CERVICOVAGINAL ANCILLARY ONLY
Bacterial Vaginitis (gardnerella): POSITIVE — AB
Candida Glabrata: NEGATIVE
Candida Vaginitis: NEGATIVE
Comment: NEGATIVE
Comment: NEGATIVE
Comment: NEGATIVE

## 2022-10-16 ENCOUNTER — Ambulatory Visit: Payer: Self-pay

## 2022-10-16 ENCOUNTER — Telehealth: Payer: Self-pay | Admitting: Emergency Medicine

## 2022-10-16 MED ORDER — METRONIDAZOLE 500 MG PO TABS: 500.0000 mg | ORAL_TABLET | Freq: Two times a day (BID) | ORAL | 0 refills | Status: DC

## 2022-10-16 NOTE — Telephone Encounter (Signed)
Pt had made appointment but was noted pt positive for BV; spoke with pt and was just looking for meds; RX sent to [facility]

## 2022-10-17 LAB — URINE CULTURE: Culture: >=100000 — AB

## 2022-10-19 ENCOUNTER — Telehealth (HOSPITAL_COMMUNITY): Payer: Self-pay | Admitting: Emergency Medicine

## 2022-10-19 NOTE — Telephone Encounter (Signed)
Patient called to follow up on results.  Had not been taking the Macrobid due to having a positive Cyto, then received my confirmatory UTI message today.  She states she had not been improving with the metronidazole, and I encouraged her to start the Macrobid and monitor closely.  If any worsening, return.  If improving, finish it out.  She verbalized understanding.

## 2022-11-02 NOTE — Progress Notes (Signed)
Cardio-Obstetrics Clinic  Follow-up Evaluation  Date:  11/04/2022   ID:  Taylor [name], DOB June 25, 2000, MRN [id number]  PCP:  Lady Deutscher, MD   [facility] Providers Cardiologist:  None  Electrophysiologist:  None      Referring MD: Lady Deutscher, MD   Chief Complaint: Pre-eclampsia  History of Present Illness:    Taylor [name] is a 22 y.o. female [G1P1001] who is being seen today for the evaluation of pre-eclamspia at the request of Lady Deutscher, MD.   Patient was admitted in 12/2021 for induction of labor. Course complicated by pre-eclampsia. She was recommended for antihypertensives at discharge but these have since been stopped.  Was last seen in clinic on 07/2022 where she was having intermittent palpitations. Zio ordered but not completed.  Today, the patient is doing well. Palpitations have significantly improved. No chest pain, lightheadedness, LE edema, orthopnea or PND. Remains active without exertional symptoms.  Prior CV Studies Reviewed: The following studies were reviewed today:   Past Medical History:  Diagnosis Date   History of acute pancreatitis 05/2020   due to gallstone pancreatitis   Migraines    Mild preeclampsia    Obesity    Spina bifida occulta    MRI showed spinal epidural lipomatosis    Past Surgical History:  Procedure Laterality Date   CHOLECYSTECTOMY N/A 06/12/2020   Procedure: LAPAROSCOPIC CHOLECYSTECTOMY WITH INTRAOPERATIVE CHOLANGIOGRAM;  Surgeon: Gaynelle Adu, MD;  Location: [facility] OR;  Service: General;  Laterality: N/A;      OB History     Gravida  1   Para  1   Term  1   Preterm      AB      Living  1      SAB      IAB      Ectopic      Multiple  0   Live Births  1               Current Medications: No outpatient medications have been marked as taking for the 11/04/22 encounter (Office Visit) with Meriam Sprague, MD.     Allergies:   Patient has no known  allergies.   Social History   Socioeconomic History   Marital status: Single    Spouse name: Not on file   Number of children: Not on file   Years of education: Not on file   Highest education level: Not on file  Occupational History   Not on file  Tobacco Use   Smoking status: Never   Smokeless tobacco: Never  Vaping Use   Vaping Use: Never used  Substance and Sexual Activity   Alcohol use: Never   Drug use: Never   Sexual activity: Yes    Birth control/protection: Implant  Other Topics Concern   Not on file  Social History Narrative   Taylor [name] lives at home with mom, dad and siblings. She is a Printmaker in college. She does well in school. She enjoys reading, writing and singing   Social Determinants of Health   Financial Resource Strain: Low Risk  (06/28/2021)   Overall Financial Resource Strain (CARDIA)    Difficulty of Paying Living Expenses: Not hard at all  Food Insecurity: No Food Insecurity (06/28/2021)   Hunger Vital Sign    Worried About Running Out of Food in the Last Year: Never true    Ran Out of Food in the Last Year: Never true  Transportation Needs: No Transportation Needs (06/28/2021)  PRAPARE - Administrator, Civil Service (Medical): No    Lack of Transportation (Non-Medical): No  Physical Activity: Sufficiently Active (06/28/2021)   Exercise Vital Sign    Days of Exercise per Week: 5 days    Minutes of Exercise per Session: 30 min  Stress: No Stress Concern Present (06/28/2021)   [employer] of Occupational Health - Occupational Stress Questionnaire    Feeling of Stress : Only a little  Social Connections: Unknown (06/28/2021)   Social Connection and Isolation Panel [NHANES]    Frequency of Communication with Friends and Family: More than three times a week    Frequency of Social Gatherings with Friends and Family: Three times a week    Attends Religious Services: More than 4 times per year    Active Member of Clubs or Organizations: No     Attends Banker Meetings: Never    Marital Status: Patient declined      Family History  Problem Relation Age of Onset   Anxiety disorder Mother    Asthma Sister    Hypertension Maternal Aunt    Cancer Maternal Uncle    Migraines Neg Hx    Seizures Neg Hx    Autism Neg Hx    Depression Neg Hx    ADD / ADHD Neg Hx    Bipolar disorder Neg Hx    Schizophrenia Neg Hx    Birth defects Neg Hx    Diabetes Neg Hx    Heart disease Neg Hx    Stroke Neg Hx       ROS:   Please see the history of present illness.     All other systems reviewed and are negative.   Labs/EKG Reviewed:    EKG:   No new tracing  Recent Labs: 01/17/2022: ALT 10; BUN 7; Creatinine, Ser 0.68; Potassium 3.3; Sodium 135 08/05/2022: Hemoglobin 11.9; Platelets 477; TSH 1.290   Recent Lipid Panel Lab Results  Component Value Date/Time   CHOL 172 (H) 06/11/2020 08:16 AM   TRIG 125 06/11/2020 08:16 AM   HDL 38 (L) 06/11/2020 08:16 AM   CHOLHDL 4.5 06/11/2020 08:16 AM   LDLCALC 109 (H) 06/11/2020 08:16 AM    Physical Exam:    VS:  LMP  (LMP Unknown)     Wt Readings from Last 3 Encounters:  09/23/22 191 lb (86.6 kg)  07/29/22 188 lb (85.3 kg) (96 %, Z= 1.74)*  06/14/22 186 lb (84.4 kg) (96 %, Z= 1.71)*   * Growth percentiles are based on CDC (Girls, 2-20 Years) data.     GEN:  Well nourished, well developed in no acute distress HEENT: Normal NECK: No JVD; No carotid bruits CARDIAC: RRR, no murmurs RESPIRATORY:  CTAB, no wheezes ABDOMEN: Soft, NTTP MUSCULOSKELETAL:  No edema; No deformity  SKIN: Warm and dry NEUROLOGIC:  Alert and oriented x 3 PSYCHIATRIC:  Normal affect    Risk Assessment/Risk Calculators:     ASSESSMENT & PLAN:    #Palpitations Zio monitor ordered at last visit but not completed. Palpitations significantly improved with very rare episodes. TSH and HgB normal. Given significant improvement, will continue with symptomatic management -Continue  hydration -Continue electrolyte replacement and small frequent meals  #Pre-Eclampsia: Developed at 40w gestation and was placed on antihypertensives with significant improvement. Has since been weaned off BP meds and blood pressure is well controlled. Discussed that she is high risk for recurrence of pre-eclampsia with subsequent pregnancies as well as chronic HTN and CV disease  later in life. Will continue with screening and prevention. If she is to become pregnant in the future, will follow at Cardio-OB clinic at that time.   #Anemia: Resolved and normalized on labs in 07/2022  There are no Patient Instructions on file for this visit.   Dispo:  No follow-ups on file.   Medication Adjustments/Labs and Tests Ordered: Current medicines are reviewed at length with the patient today.  Concerns regarding medicines are outlined above.  Tests Ordered: No orders of the defined types were placed in this encounter.  Medication Changes: No orders of the defined types were placed in this encounter.

## 2022-11-04 ENCOUNTER — Ambulatory Visit (INDEPENDENT_AMBULATORY_CARE_PROVIDER_SITE_OTHER): Payer: Medicaid Other | Admitting: Cardiology

## 2022-11-04 DIAGNOSIS — Z8759 Personal history of other complications of pregnancy, childbirth and the puerperium: Secondary | ICD-10-CM

## 2022-11-04 DIAGNOSIS — O1492 Unspecified pre-eclampsia, second trimester: Secondary | ICD-10-CM

## 2022-11-04 DIAGNOSIS — R002 Palpitations: Secondary | ICD-10-CM

## 2022-11-04 DIAGNOSIS — Z79899 Other long term (current) drug therapy: Secondary | ICD-10-CM

## 2022-11-04 NOTE — Patient Instructions (Signed)
Medication Instructions:   Your physician recommends that you continue on your current medications as directed. Please refer to the Current Medication list given to you today.  *If you need a refill on your cardiac medications before your next appointment, please call your pharmacy*     Follow-Up:  AS NEEDED WITH DR. TOBB AT [facility] OR [facility]

## 2023-03-13 ENCOUNTER — Encounter (HOSPITAL_BASED_OUTPATIENT_CLINIC_OR_DEPARTMENT_OTHER): Payer: Self-pay | Admitting: Obstetrics & Gynecology

## 2023-06-26 ENCOUNTER — Ambulatory Visit: Payer: Self-pay | Admitting: Pediatrics

## 2023-08-22 ENCOUNTER — Other Ambulatory Visit: Payer: Self-pay | Admitting: Pediatrics

## 2023-08-22 ENCOUNTER — Telehealth: Admitting: Physician Assistant

## 2023-08-22 DIAGNOSIS — J4521 Mild intermittent asthma with (acute) exacerbation: Secondary | ICD-10-CM | POA: Diagnosis not present

## 2023-08-23 MED ORDER — ALBUTEROL SULFATE HFA 108 (90 BASE) MCG/ACT IN AERS: 1.0000 | INHALATION_SPRAY | Freq: Four times a day (QID) | RESPIRATORY_TRACT | 1 refills | Status: DC | PRN

## 2023-08-23 MED ORDER — PREDNISONE 20 MG PO TABS: 40.0000 mg | ORAL_TABLET | Freq: Every day | ORAL | 0 refills | Status: DC

## 2023-08-23 NOTE — Progress Notes (Signed)
 I have spent 5 minutes in review of e-visit questionnaire, review and updating patient chart, medical decision making and response to patient.   Piedad Climes, PA-C

## 2023-08-23 NOTE — Progress Notes (Signed)

## 2023-10-03 ENCOUNTER — Ambulatory Visit (INDEPENDENT_AMBULATORY_CARE_PROVIDER_SITE_OTHER): Admitting: Family

## 2023-10-03 ENCOUNTER — Encounter: Payer: Self-pay | Admitting: Family

## 2023-10-03 ENCOUNTER — Ambulatory Visit (INDEPENDENT_AMBULATORY_CARE_PROVIDER_SITE_OTHER)

## 2023-10-03 VITALS — BP 104/76 | HR 91 | Temp 98.7°F | Resp 18 | Ht 60.0 in | Wt 224.0 lb

## 2023-10-03 DIAGNOSIS — Z13228 Encounter for screening for other metabolic disorders: Secondary | ICD-10-CM

## 2023-10-03 DIAGNOSIS — Z8615 Personal history of latent tuberculosis infection: Secondary | ICD-10-CM

## 2023-10-03 DIAGNOSIS — R7303 Prediabetes: Secondary | ICD-10-CM | POA: Diagnosis not present

## 2023-10-03 DIAGNOSIS — Z1329 Encounter for screening for other suspected endocrine disorder: Secondary | ICD-10-CM | POA: Diagnosis not present

## 2023-10-03 DIAGNOSIS — Z0000 Encounter for general adult medical examination without abnormal findings: Secondary | ICD-10-CM

## 2023-10-03 DIAGNOSIS — Z7689 Persons encountering health services in other specified circumstances: Secondary | ICD-10-CM

## 2023-10-03 DIAGNOSIS — Z131 Encounter for screening for diabetes mellitus: Secondary | ICD-10-CM | POA: Diagnosis not present

## 2023-10-03 DIAGNOSIS — Z130 Encounter for screening for diseases of the blood and blood-forming organs and certain disorders involving the immune mechanism: Secondary | ICD-10-CM | POA: Diagnosis not present

## 2023-10-03 DIAGNOSIS — Z13220 Encounter for screening for lipoid disorders: Secondary | ICD-10-CM | POA: Diagnosis not present

## 2023-10-03 NOTE — Progress Notes (Signed)
 No concerns.

## 2023-10-03 NOTE — Progress Notes (Signed)
 Subjective:    Taylor [name] - 23 y.o. female MRN [id number]  Date of birth: March 12, 2001  HPI  [name] is to establish care and annual physical exam.  Current issues and/or concerns: - States she is a Runner, broadcasting/film/video. 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 States she was then referred to the [facility] Department where she was prescribed latent TB medications and a chest xray was completed. 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 She has a form from nursing school for completion and plans to ask her former primary provider to complete.  - Established with Gynecology for women's health maintenance and screenings.  - No further issues/concerns for discussion today.   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 She has never used smokeless tobacco. She reports that she does not drink alcohol and does not use drugs.   Family History  family history includes Anxiety disorder in her mother; Asthma in her sister; Cancer in her maternal uncle; Hypertension in her maternal aunt.   Medications: reviewed and updated   Objective:   Physical Exam BP 104/76   Pulse 91   Temp 98.7 F (37.1 C) (Oral)   Resp 18   Ht 5' (1.524 m)   Wt 224 lb (101.6 kg)   LMP  (Exact Date)   SpO2 97%   BMI 43.75 kg/m   Physical Exam HENT:      Head: Normocephalic and atraumatic.     Right Ear: Tympanic membrane, ear canal and external ear normal.     Left Ear: Tympanic membrane, ear canal and external ear normal.     Nose: Nose normal.     Mouth/Throat:     Mouth: Mucous membranes are moist.     Pharynx: Oropharynx is clear.  Eyes:     Extraocular Movements: Extraocular movements intact.     Conjunctiva/sclera: Conjunctivae normal.     Pupils: Pupils are equal, round, and reactive to light.  Neck:     Thyroid : No thyroid  mass, thyromegaly or thyroid  tenderness.  Cardiovascular:     Rate and Rhythm: Normal rate and regular rhythm.     Pulses: Normal pulses.     Heart sounds: Normal heart sounds.  Pulmonary:     Effort: Pulmonary effort is normal.     Breath sounds: Normal breath sounds.  Chest:     Comments: Patient declined. Abdominal:     General: Bowel sounds are normal.     Palpations: Abdomen is soft.  Genitourinary:    Comments: Patient declined. Musculoskeletal:        General: Normal range of motion.     Right shoulder: Normal.     Left shoulder: Normal.     Right upper arm: Normal.     Left upper  arm: Normal.     Right elbow: Normal.     Left elbow: Normal.     Right forearm: Normal.     Left forearm: Normal.     Right wrist: Normal.     Left wrist: Normal.     Right hand: Normal.     Left hand: Normal.     Cervical back: Normal, normal range of motion and neck supple.     Thoracic back: Normal.     Lumbar back: Normal.     Right hip: Normal.     Left hip: Normal.     Right upper leg: Normal.     Left upper leg: Normal.     Right knee: Normal.     Left knee: Normal.     Right lower leg: Normal.     Left lower leg: Normal.     Right ankle: Normal.     Left ankle: Normal.     Right foot: Normal.     Left foot: Normal.  Skin:    General: Skin is warm and dry.     Capillary Refill: Capillary refill takes less than 2 seconds.  Neurological:     General: No focal deficit present.     Mental  Status: She is alert and oriented to person, place, and time.  Psychiatric:        Mood and Affect: Mood normal.        Behavior: Behavior normal.        Assessment & Plan:  1. Encounter to establish care (Primary) 2. Annual physical exam - Counseled on 150 minutes of exercise per week as tolerated, healthy eating (including decreased daily intake of saturated fats, cholesterol, added sugars, sodium), STI prevention, and routine healthcare maintenance.  3. Screening for metabolic disorder - Routine screening.  - CMP14+EGFR  4. Screening for deficiency anemia - Routine screening.  - CBC  5. Diabetes mellitus screening - Routine screening.  - Hemoglobin A1c  6. Screening cholesterol level - Routine screening.  - Lipid panel  7. Thyroid  disorder screen - Routine screening.  - TSH  8. History of latent tuberculosis - Routine screening.  - DG Chest 2 View; Future    Patient was given clear instructions to go to Emergency Department or return to medical center if symptoms don't improve, worsen, or new problems develop.The patient verbalized understanding.  I discussed the assessment and treatment plan with the patient. The patient was provided an opportunity to ask questions and all were answered. The patient agreed with the plan and demonstrated an understanding of the instructions.   The patient was advised to call back or seek an in-person evaluation if the symptoms worsen or if the condition fails to improve as anticipated.    Lavona Pounds, NP 10/03/2023, 3:02 PM Primary Care at [facility]

## 2023-10-04 ENCOUNTER — Telehealth (INDEPENDENT_AMBULATORY_CARE_PROVIDER_SITE_OTHER): Admitting: Pediatrics

## 2023-10-04 ENCOUNTER — Ambulatory Visit: Payer: Self-pay | Admitting: Family

## 2023-10-04 ENCOUNTER — Telehealth: Payer: Self-pay

## 2023-10-04 DIAGNOSIS — Z0289 Encounter for other administrative examinations: Secondary | ICD-10-CM | POA: Diagnosis not present

## 2023-10-04 DIAGNOSIS — Z13220 Encounter for screening for lipoid disorders: Secondary | ICD-10-CM

## 2023-10-04 NOTE — Progress Notes (Signed)
 Virtual Visit via Video Note  I connected with Taylor [name] 's patient  on 10/04/23 at 12:50 PM EDT by a video enabled telemedicine application and verified that I am speaking with the correct person using two identifiers.   Location of patient/parent: school   I discussed the limitations of evaluation and management by telemedicine and the availability of in person appointments.  I advised the patient  that by engaging in this telehealth visit, they consent to the provision of healthcare.  Additionally, they authorize for the patient's insurance to be billed for the services provided during this telehealth visit.  They expressed understanding and agreed to proceed.  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 Patient is an overweight, but healthy 23yo. No medical issues other than mild intermittent asthma (on albuterol  PRN). Did great post-baby. No depression, no anxiety. Great support system.  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 No physical limitations. No mental health limitations. Filled out form and sent to personal email per patient request.  Follow Up Instructions: PRN   I discussed the assessment and treatment plan with the patient and/or parent/guardian. They were provided an opportunity to ask questions and all were answered. They agreed with the plan and demonstrated an understanding of the instructions.   They were advised to call back or seek an in-person evaluation in the emergency room if the symptoms worsen or if the condition fails to improve as anticipated.  Time spent reviewing chart in preparation for visit:  5 minutes Time spent face-to-face with  patient: 5 minutes Time spent not face-to-face with patient for documentation and care coordination on date of service: 2 minutes  I was located at [facility] during this encounter.  Canda Cera, MD

## 2023-10-04 NOTE — Telephone Encounter (Signed)
 Routing to office

## 2023-10-04 NOTE — Telephone Encounter (Signed)
 Copied from CRM #[id number]. Topic: Clinical - Lab/Test Results >> Oct 04, 2023 11:45 AM Lotus Round B wrote: Reason for CRM: pt called in stating thst she got a call and someone left a vm about her labs . Looks like they haven't been reviewed yet . Looks like in her chart that Amadeo June called her and left a vm . Called CAL but patient hung up.  If someone can give her a call back

## 2023-10-05 LAB — HEMOGLOBIN A1C
Est. average glucose Bld gHb Est-mCnc: 117 mg/dL
Hgb A1c MFr Bld: 5.7 % — ABNORMAL HIGH (ref 4.8–5.6)

## 2023-10-05 LAB — LIPID PANEL
Chol/HDL Ratio: 5.2 ratio — ABNORMAL HIGH (ref 0.0–4.4)
Cholesterol, Total: 178 mg/dL (ref 100–199)
HDL: 34 mg/dL — ABNORMAL LOW (ref >39)
LDL Chol Calc (NIH): 117 mg/dL — ABNORMAL HIGH (ref 0–99)
Triglycerides: 149 mg/dL (ref 0–149)
VLDL Cholesterol Cal: 27 mg/dL (ref 5–40)

## 2023-10-05 LAB — CMP14+EGFR
ALT: 12 IU/L (ref 0–32)
AST: 17 IU/L (ref 0–40)
Albumin: 4.4 g/dL (ref 4.0–5.0)
Alkaline Phosphatase: 109 IU/L (ref 44–121)
BUN/Creatinine Ratio: 18 (ref 9–23)
BUN: 14 mg/dL (ref 6–20)
Bilirubin Total: 0.2 mg/dL (ref 0.0–1.2)
CO2: 20 mmol/L (ref 20–29)
Calcium: 9.7 mg/dL (ref 8.7–10.2)
Chloride: 106 mmol/L (ref 96–106)
Creatinine, Ser: 0.77 mg/dL (ref 0.57–1.00)
Globulin, Total: 2.6 g/dL (ref 1.5–4.5)
Glucose: 83 mg/dL (ref 70–99)
Potassium: 4.5 mmol/L (ref 3.5–5.2)
Sodium: 142 mmol/L (ref 134–144)
Total Protein: 7 g/dL (ref 6.0–8.5)
eGFR: 112 mL/min/{1.73_m2} (ref >59)

## 2023-10-05 LAB — CBC
Hematocrit: 40.1 % (ref 34.0–46.6)
Hemoglobin: 12.8 g/dL (ref 11.1–15.9)
MCH: 26.5 pg — ABNORMAL LOW (ref 26.6–33.0)
MCHC: 31.9 g/dL (ref 31.5–35.7)
MCV: 83 fL (ref 79–97)
Platelets: 422 10*3/uL (ref 150–450)
RBC: 4.83 x10E6/uL (ref 3.77–5.28)
RDW: 14.7 % (ref 11.7–15.4)
WBC: 10.3 10*3/uL (ref 3.4–10.8)

## 2023-10-05 LAB — TSH: TSH: 2.04 u[IU]/mL (ref 0.450–4.500)

## 2023-10-09 NOTE — Telephone Encounter (Signed)
Patient was called and identified. Patient was given lab results and had no questions  

## 2023-10-19 ENCOUNTER — Ambulatory Visit: Admitting: Family

## 2023-11-07 ENCOUNTER — Encounter: Admitting: Family

## 2023-11-15 ENCOUNTER — Ambulatory Visit (INDEPENDENT_AMBULATORY_CARE_PROVIDER_SITE_OTHER): Admitting: Family

## 2023-11-15 ENCOUNTER — Encounter: Payer: Self-pay | Admitting: Family

## 2023-11-15 VITALS — BP 116/74 | HR 67 | Temp 98.3°F | Resp 16 | Ht 60.0 in | Wt 216.8 lb

## 2023-11-15 DIAGNOSIS — Z13220 Encounter for screening for lipoid disorders: Secondary | ICD-10-CM

## 2023-11-15 NOTE — Progress Notes (Signed)
 Patient ID: Taylor [name], female    DOB: 04/15/2001  MRN: [id number]  CC: Follow-Up  Subjective: Taylor [name] is a 23 y.o. female who presents for follow-up.   Her concerns today include:  Patient presents today to recheck cholesterol lab. She is watching what she eats. No further issues/concerns for discussion today.   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 6.7 g 1   cetirizine  (ZYRTEC  ALLERGY) 10 MG tablet Take 1 tablet (10 mg total) by mouth at bedtime. (Patient taking differently: Take 10 mg by mouth at bedtime. Taking as needed) 90 tablet 1   Multiple Vitamin (MULTIVITAMIN) tablet Take 1 tablet by mouth daily.     VENTOLIN  HFA 108 (90 Base) MCG/ACT inhaler INHALE 2 PUFFS INTO THE LUNGS EVERY 4 HOURS AS NEEDED FOR WHEEZING OR SHORTNESS OF BREATH. 54 each 2   isoniazid (NYDRAZID) 300 MG tablet Take by mouth daily. (Patient not taking: Reported on 10/03/2023)     predniSONE  (DELTASONE ) 20 MG tablet Take 2 tablets (40 mg total) by mouth daily with breakfast. (Patient not taking: Reported on 10/03/2023) 10 tablet 0   No current facility-administered medications on file prior to visit.    No Known Allergies  Social History   Socioeconomic History   Marital status: Single    Spouse name: Not on file   Number of children: Not on file   Years of education: Not on file   Highest education level: Not on file  Occupational History   Not on file  Tobacco Use   Smoking status: Never   Smokeless tobacco: Never  Vaping  Use   Vaping status: Never Used  Substance and Sexual Activity   Alcohol use: Never   Drug use: Never   Sexual activity: Yes    Birth control/protection: Implant  Other Topics Concern   Not on file  Social History Narrative   Scheryl lives at home with mom, dad and siblings. She is a Printmaker in college. She does well in school. She enjoys reading, writing and singing   Social Drivers of Health   Financial Resource Strain: Low Risk  (10/03/2023)   Overall Financial Resource Strain (CARDIA)    Difficulty of Paying Living Expenses: Not hard at all  Food Insecurity: No Food Insecurity (10/03/2023)   Hunger Vital Sign    Worried About Running Out of Food in the Last Year: Never true    Ran Out of Food in the Last Year: Never true  Transportation Needs: No Transportation Needs (10/03/2023)   PRAPARE - Administrator, Civil Service (Medical): No    Lack of Transportation (Non-Medical): No  Physical Activity: Insufficiently Active (10/03/2023)   Exercise Vital Sign    Days of Exercise per Week: 3 days    Minutes of Exercise per Session: 30 min  Stress: No Stress Concern Present (10/03/2023)   [employer] of Occupational Health - Occupational Stress Questionnaire    Feeling of Stress : Only a little  Social Connections: Moderately Integrated (10/03/2023)   Social  Connection and Isolation Panel    Frequency of Communication with Friends and Family: More than three times a week    Frequency of Social Gatherings with Friends and Family: Three times a week    Attends Religious Services: More than 4 times per year    Active Member of Clubs or Organizations: No    Attends Banker Meetings: Never    Marital Status: Living with partner  Intimate Partner Violence: Not At Risk (10/03/2023)   Humiliation, Afraid, Rape, and Kick questionnaire    Fear of Current or Ex-Partner: No    Emotionally Abused: No    Physically Abused: No    Sexually Abused: No    Family  History  Problem Relation Age of Onset   Anxiety disorder Mother    Asthma Sister    Hypertension Maternal Aunt    Cancer Maternal Uncle    Migraines Neg Hx    Seizures Neg Hx    Autism Neg Hx    Depression Neg Hx    ADD / ADHD Neg Hx    Bipolar disorder Neg Hx    Schizophrenia Neg Hx    Birth defects Neg Hx    Diabetes Neg Hx    Heart disease Neg Hx    Stroke Neg Hx     Past Surgical History:  Procedure Laterality Date   CHOLECYSTECTOMY N/A 06/12/2020   Procedure: LAPAROSCOPIC CHOLECYSTECTOMY WITH INTRAOPERATIVE CHOLANGIOGRAM;  Surgeon: Tanda Locus, MD;  Location: [facility] OR;  Service: General;  Laterality: N/A;    ROS: Review of Systems Negative except as stated above  PHYSICAL EXAM: BP 116/74   Pulse 67   Temp 98.3 F (36.8 C) (Oral)   Resp 16   Ht 5' (1.524 m)   Wt 216 lb 12.8 oz (98.3 kg)   SpO2 97%   Breastfeeding No   BMI 42.34 kg/m   Physical Exam HENT:     Head: Normocephalic and atraumatic.     Nose: Nose normal.     Mouth/Throat:     Mouth: Mucous membranes are moist.     Pharynx: Oropharynx is clear.   Eyes:     Extraocular Movements: Extraocular movements intact.     Conjunctiva/sclera: Conjunctivae normal.     Pupils: Pupils are equal, round, and reactive to light.    Cardiovascular:     Rate and Rhythm: Normal rate and regular rhythm.     Pulses: Normal pulses.     Heart sounds: Normal heart sounds.  Pulmonary:     Effort: Pulmonary effort is normal.     Breath sounds: Normal breath sounds.   Musculoskeletal:        General: Normal range of motion.     Cervical back: Normal range of motion and neck supple.   Neurological:     General: No focal deficit present.     Mental Status: She is alert and oriented to person, place, and time.   Psychiatric:        Mood and Affect: Mood normal.        Behavior: Behavior normal.     ASSESSMENT AND PLAN: 1. Screening cholesterol level (Primary) - Routine screening.  - Lipid  panel  Patient was given the opportunity to ask questions.  Patient verbalized understanding of the plan and was able to repeat key elements of the plan. Patient was given clear instructions to go to Emergency Department or return to medical center if symptoms don't improve, worsen, or new problems develop.The patient  verbalized understanding.   Orders Placed This Encounter  Procedures   Lipid panel    Follow-up with primary provider as scheduled.  Greig JINNY Drones, NP

## 2023-11-15 NOTE — Progress Notes (Signed)
 6 week follow up

## 2023-11-16 ENCOUNTER — Ambulatory Visit: Payer: Self-pay | Admitting: Family

## 2023-11-16 DIAGNOSIS — E785 Hyperlipidemia, unspecified: Secondary | ICD-10-CM

## 2023-11-16 LAB — LIPID PANEL
Chol/HDL Ratio: 4.7 ratio — ABNORMAL HIGH (ref 0.0–4.4)
Cholesterol, Total: 160 mg/dL (ref 100–199)
HDL: 34 mg/dL — ABNORMAL LOW (ref >39)
LDL Chol Calc (NIH): 107 mg/dL — ABNORMAL HIGH (ref 0–99)
Triglycerides: 101 mg/dL (ref 0–149)
VLDL Cholesterol Cal: 19 mg/dL (ref 5–40)

## 2023-11-16 MED ORDER — ATORVASTATIN CALCIUM 20 MG PO TABS: 20.0000 mg | ORAL_TABLET | Freq: Every day | ORAL | 0 refills | Status: DC

## 2023-12-12 ENCOUNTER — Ambulatory Visit
Admission: RE | Admit: 2023-12-12 | Discharge: 2023-12-12 | Disposition: A | Discharge status: Home / Self Care | Source: Ambulatory Visit | Attending: Physician Assistant | Admitting: Physician Assistant

## 2023-12-12 ENCOUNTER — Ambulatory Visit

## 2023-12-12 ENCOUNTER — Other Ambulatory Visit: Payer: Self-pay

## 2023-12-12 ENCOUNTER — Ambulatory Visit: Payer: Self-pay

## 2023-12-12 VITALS — BP 112/74 | HR 77 | Temp 98.4°F | Resp 19 | Ht 60.0 in | Wt 216.7 lb

## 2023-12-12 DIAGNOSIS — G43811 Other migraine, intractable, with status migrainosus: Secondary | ICD-10-CM

## 2023-12-12 MED ORDER — ONDANSETRON HCL 4 MG/2ML IJ SOLN
4.0000 mg | Freq: Once | INTRAMUSCULAR | Status: AC
  Administered 2023-12-12: 4 mg via INTRAMUSCULAR

## 2023-12-12 MED ORDER — SUMATRIPTAN SUCCINATE 6 MG/0.5ML ~~LOC~~ SOLN
6.0000 mg | Freq: Once | SUBCUTANEOUS | Status: AC
  Administered 2023-12-12: 6 mg via SUBCUTANEOUS

## 2023-12-12 MED ORDER — DIPHENHYDRAMINE HCL 50 MG/ML IJ SOLN
50.0000 mg | INTRAMUSCULAR | Status: AC
  Administered 2023-12-12: 50 mg via INTRAMUSCULAR

## 2023-12-12 NOTE — ED Provider Notes (Signed)
 [name]    CSN: [id number] Arrival date & time: 12/12/23  1425      History   Chief Complaint Chief Complaint  Patient presents with   Migraine    Very bad migraine ongoing for 5 days , it affects my vision - Entered by patient    HPI Taylor [name] is a 23 y.o. female.   HPI  Pt presents today with concerns for persistent migraine She states this has been ongoing for about 5 days. She states last night was the most severe pain but it improved this AM She reports some dizziness today and states moving her eyes was very painful She reports the eye pain is typical of her migraines- she states she hasn't had one in almost 2 years but is not sure what is triggering it  She states light seems to make it worse and at night her symptoms have been getting better She reports she took a pregnancy test earlier today and it was negative. She states she has had nausea but no vomiting and some diarrhea  She states she has tried Tylenol , Ibuprofen , Aspirin  She reports similar symptoms when she was having preeclampsia but her BP has been normal at home   Pt reports that her mother drove her and will pick her up once apt is over    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   Body mass index (BMI) 32.0-32.9, adult 06/29/2021   Migraine headache 03/16/2017   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 Authorizing Provider  albuterol  (VENTOLIN  HFA) 108 (90 Base) MCG/ACT inhaler Inhale 1-2 puffs into the lungs every 6 (six) hours as needed for wheezing or shortness of breath. 08/23/23   Gladis Elsie BROCKS, PA-C  atorvastatin  (LIPITOR) 20 MG tablet Take 1 tablet (20 mg total) by mouth daily. 11/16/23   Lorren Greig PARAS, NP  cetirizine  (ZYRTEC  ALLERGY) 10 MG tablet Take 1 tablet (10 mg total) by mouth at bedtime. Patient taking differently: Take 10 mg by mouth at bedtime. Taking as needed 09/06/22 11/15/23  Joesph Shaver Scales, PA-C  isoniazid (NYDRAZID) 300 MG tablet Take by mouth daily. Patient not taking: Reported on 10/03/2023    [provider]  Multiple Vitamin (MULTIVITAMIN) tablet Take 1 tablet by mouth daily.    [provider]  predniSONE  (DELTASONE ) 20 MG tablet Take 2  tablets (40 mg total) by mouth daily with breakfast. Patient not taking: Reported on 10/03/2023 08/23/23   Gladis Elsie BROCKS, PA-C  VENTOLIN  HFA 108 (90 Base) MCG/ACT inhaler INHALE 2 PUFFS INTO THE LUNGS EVERY 4 HOURS AS NEEDED FOR WHEEZING OR SHORTNESS OF BREATH. 08/23/23   Gretel Andes, MD    Family History Family History  Problem Relation Age of Onset   Anxiety disorder Mother    Asthma Sister    Hypertension Maternal Aunt    Cancer Maternal Uncle    Migraines Neg Hx    Seizures Neg Hx    Autism Neg Hx    Depression Neg Hx    ADD / ADHD Neg Hx    Bipolar disorder Neg Hx    Schizophrenia Neg Hx    Birth defects Neg Hx    Diabetes Neg Hx    Heart disease Neg Hx    Stroke Neg Hx     Social History Social History   Tobacco Use   Smoking status: Never   Smokeless tobacco: Never  Vaping Use   Vaping status: Never Used  Substance  Use Topics   Alcohol use: Never   Drug use: Never     Allergies   Patient has no known allergies.   Review of Systems Review of Systems  Eyes:  Positive for photophobia, pain and visual disturbance.  Gastrointestinal:  Positive for diarrhea and nausea. Negative for vomiting.  Neurological:  Positive for dizziness and headaches.     Physical Exam Triage Vital Signs ED Triage Vitals  Encounter Vitals Group     BP 12/12/23 1458 112/74     Girls Systolic BP Percentile --      Girls Diastolic BP Percentile --      Boys Systolic BP Percentile --      Boys Diastolic BP Percentile --      Pulse Rate 12/12/23 1458 77     Resp 12/12/23 1458 19     Temp 12/12/23 1458 98.4 F (36.9 C)     Temp Source 12/12/23 1458 Oral     SpO2 12/12/23 1458 98 %     Weight 12/12/23 1459 216 lb 11.4 oz (98.3 kg)     Height 12/12/23 1459 5' (1.524 m)     Head Circumference --      Peak Flow --      Pain Score 12/12/23 1459 5     Pain Loc --      Pain Education --      Exclude from Growth Chart --    No data found.  Updated Vital Signs BP 112/74 (BP Location: Right Arm)   Pulse 77   Temp 98.4 F (36.9 C) (Oral)   Resp 19   Ht 5' (1.524 m)   Wt 216 lb 11.4 oz (98.3 kg)   SpO2 98%   Breastfeeding No   BMI 42.32 kg/m   Visual Acuity Right Eye Distance:   Left Eye Distance:   Bilateral Distance:    Right Eye Near:   Left Eye Near:    Bilateral Near:     Physical Exam Vitals reviewed.  Constitutional:      General: She is awake. She is not in acute distress.    Appearance: Normal appearance. She is well-developed and well-groomed. She is not ill-appearing or toxic-appearing.  HENT:     Head: Normocephalic and atraumatic.  Eyes:     General: Lids are normal. Gaze aligned appropriately.     Extraocular Movements:  Extraocular movements intact.     Conjunctiva/sclera: Conjunctivae normal.     Pupils: Pupils are equal, round, and reactive to light. Pupils are equal.     Right  eye: Pupil is not sluggish.     Left eye: Pupil is not sluggish.     Comments: No obvious evidence of papilledema but fundoscopy exam is limited due to pt moving her eyes during exam    Pulmonary:     Effort: Pulmonary effort is normal.  Neurological:     General: No focal deficit present.     Mental Status: She is alert and oriented to person, place, and time.     GCS: GCS eye subscore is 4. GCS verbal subscore is 5. GCS motor subscore is 6.     Cranial Nerves: Cranial nerves 2-12 are intact. No cranial nerve deficit, dysarthria or facial asymmetry.     Motor: No weakness, tremor, atrophy or abnormal muscle tone.     Gait: Gait is intact.  Psychiatric:        Attention and Perception: Attention and perception normal.        Mood and Affect: Mood and affect normal.        Speech: Speech normal.        Behavior: Behavior normal. Behavior is cooperative.      UC Treatments / Results  Labs (all labs ordered are listed, but only abnormal results are displayed) Labs Reviewed - No data to display  EKG   Radiology No results found.  Procedures Procedures (including critical care time)  Medications Ordered in UC Medications  ondansetron  (ZOFRAN ) injection 4 mg (4 mg Intramuscular Given 12/12/23 1547)  SUMAtriptan  (IMITREX ) injection 6 mg (6 mg Subcutaneous Given 12/12/23 1544)  diphenhydrAMINE  (BENADRYL ) injection 50 mg (50 mg Intramuscular Given 12/12/23 1547)    Initial Impression / Assessment and Plan / UC Course  I have reviewed the triage vital signs and the nursing notes.  Pertinent labs & imaging results that were available during my care of the patient were reviewed by me and considered in my medical decision making (see chart for details).      Final Clinical Impressions(s) / UC Diagnoses   Final diagnoses:  Other migraine with status migrainosus, intractable   Pt presents today with concerns for persistent migraine that has been ongoing for 5 days. She reports  having eye pain, vision changes, nausea and dizziness. She states the vision changes and dizziness can be typical of her migraine attacks but the nausea is new. Physical exam, neurological exam are reassuring. Attempted funduscopic exam but this was limited due to patient's eye movements and discomfort with the light. No obvious signs of papilledema but cannot definitively rule out. Will provide migraine cocktail with Zofran  4mg , Sumatriptan  6 mg, Benadryl  50 mg injections to assist with symptoms. Recommend follow up with PCP if migraines return or persist as she may need referral to Neurology. ED and return precautions reviewed and provided in AVS. Follow up as needed for persistent or progressing symptoms      Discharge Instructions      You were seen today for concerns of a persistent migraine headache that has been ongoing for several days We have provided you with a migraine cocktail to assist with your symptoms. This was comprised of 3 medications given in injections: Zofran  4 mg for nausea, Sumatriptan  6 mg for migraine treatment, Benadryl  50 mg for nausea and dizziness.  You may feel a bit drowsy after the medications are administered  which is normal but please do not drive after receiving the medications If you develop the following please go to the ED for evaluation and management: more severe headache pain, vision changes or vision loss, loss of consciousness and unresponsiveness, facial drooping, difficulty speaking, severe nausea and vomiting, confusion.      ED Prescriptions   None    PDMP not reviewed this encounter.   Marylene Rocky BRAVO, PA-C 12/12/23 1653

## 2023-12-12 NOTE — ED Triage Notes (Signed)
 Pt presents with a chief complaint of ongoing migraine x 5 days. OTC Ibuprofen  + aspirin  taken at home with no pain relief. Currently rates overall head pain a 5/10. Describes as pressure and throbbing. Beginning to affect vision and causing pt to become dizzy.

## 2023-12-12 NOTE — Discharge Instructions (Addendum)
 You were seen today for concerns of a persistent migraine headache that has been ongoing for several days We have provided you with a migraine cocktail to assist with your symptoms. This was comprised of 3 medications given in injections: Zofran  4 mg for nausea, Sumatriptan  6 mg for migraine treatment, Benadryl  50 mg for nausea and dizziness.  You may feel a bit drowsy after the medications are administered which is normal but please do not drive after receiving the medications If you develop the following please go to the ED for evaluation and management: more severe headache pain, vision changes or vision loss, loss of consciousness and unresponsiveness, facial drooping, difficulty speaking, severe nausea and vomiting, confusion.

## 2024-01-04 ENCOUNTER — Encounter

## 2024-01-17 ENCOUNTER — Ambulatory Visit (HOSPITAL_BASED_OUTPATIENT_CLINIC_OR_DEPARTMENT_OTHER): Admitting: Obstetrics & Gynecology

## 2024-01-31 ENCOUNTER — Ambulatory Visit (HOSPITAL_BASED_OUTPATIENT_CLINIC_OR_DEPARTMENT_OTHER): Admitting: Obstetrics & Gynecology

## 2024-02-05 ENCOUNTER — Telehealth: Admitting: Physician Assistant

## 2024-02-05 ENCOUNTER — Other Ambulatory Visit: Payer: Self-pay

## 2024-02-05 DIAGNOSIS — J4520 Mild intermittent asthma, uncomplicated: Secondary | ICD-10-CM

## 2024-02-05 MED ORDER — ALBUTEROL SULFATE HFA 108 (90 BASE) MCG/ACT IN AERS: 1.0000 | INHALATION_SPRAY | Freq: Four times a day (QID) | RESPIRATORY_TRACT | 0 refills | Status: DC | PRN

## 2024-02-05 MED ORDER — ALBUTEROL SULFATE HFA 108 (90 BASE) MCG/ACT IN AERS
1.0000 | INHALATION_SPRAY | Freq: Four times a day (QID) | RESPIRATORY_TRACT | 0 refills | Status: DC | PRN
  Filled 2024-02-05: qty 18, 25d supply, fill #0

## 2024-02-05 NOTE — Progress Notes (Signed)

## 2024-02-05 NOTE — Addendum Note (Signed)
 Addended by: VIVIENNE DELON HERO on: 02/05/2024 05:27 PM   Modules accepted: Orders

## 2024-02-07 ENCOUNTER — Other Ambulatory Visit: Payer: Self-pay

## 2024-02-08 ENCOUNTER — Ambulatory Visit (HOSPITAL_BASED_OUTPATIENT_CLINIC_OR_DEPARTMENT_OTHER): Admitting: Certified Nurse Midwife

## 2024-02-14 ENCOUNTER — Ambulatory Visit: Payer: Self-pay

## 2024-02-14 ENCOUNTER — Ambulatory Visit
Admission: EM | Admit: 2024-02-14 | Discharge: 2024-02-14 | Disposition: A | Discharge status: Home / Self Care | Attending: Physician Assistant | Admitting: Physician Assistant

## 2024-02-14 ENCOUNTER — Other Ambulatory Visit: Payer: Self-pay

## 2024-02-14 ENCOUNTER — Telehealth: Payer: Self-pay | Admitting: Physician Assistant

## 2024-02-14 DIAGNOSIS — R0989 Other specified symptoms and signs involving the circulatory and respiratory systems: Secondary | ICD-10-CM | POA: Diagnosis not present

## 2024-02-14 LAB — POC COVID19/FLU A&B COMBO
Covid Antigen, POC: NEGATIVE
Influenza A Antigen, POC: NEGATIVE
Influenza B Antigen, POC: NEGATIVE

## 2024-02-14 LAB — POCT RAPID STREP A (OFFICE): Rapid Strep A Screen: NEGATIVE

## 2024-02-14 MED ORDER — ALBUTEROL SULFATE HFA 108 (90 BASE) MCG/ACT IN AERS: 1.0000 | INHALATION_SPRAY | Freq: Four times a day (QID) | RESPIRATORY_TRACT | 0 refills | Status: DC | PRN

## 2024-02-14 MED ORDER — ALBUTEROL SULFATE HFA 108 (90 BASE) MCG/ACT IN AERS
1.0000 | INHALATION_SPRAY | Freq: Four times a day (QID) | RESPIRATORY_TRACT | 0 refills | Status: AC | PRN
  Filled 2024-02-14: qty 18, 23d supply, fill #0
  Filled 2024-06-16: qty 6.7, 17d supply, fill #0

## 2024-02-14 MED ORDER — PREDNISONE 20 MG PO TABS
40.0000 mg | ORAL_TABLET | Freq: Every day | ORAL | 0 refills | Status: AC
  Filled 2024-02-14: qty 10, 5d supply, fill #0

## 2024-02-14 MED ORDER — IPRATROPIUM-ALBUTEROL 0.5-2.5 (3) MG/3ML IN SOLN
3.0000 mL | Freq: Once | RESPIRATORY_TRACT | Status: AC
  Administered 2024-02-14: 3 mL via RESPIRATORY_TRACT

## 2024-02-14 MED ORDER — PREDNISONE 20 MG PO TABS: 40.0000 mg | ORAL_TABLET | Freq: Every day | ORAL | 0 refills | Status: DC

## 2024-02-14 NOTE — ED Provider Notes (Signed)
 [name]    CSN: [id number] Arrival date & time: 02/14/24  1601      History   Chief Complaint Chief Complaint  Patient presents with   Nasal Congestion   Otalgia    HPI Taylor [name] is a 23 y.o. female.  has a past medical history of History of acute pancreatitis (05/2020), Migraines, Mild preeclampsia, Obesity, and Spina bifida occulta.   HPI  Discussed the use of AI scribe software for clinical note transcription with the patient, who gave verbal consent to proceed.  The patient, with asthma, presents with ear pain and respiratory symptoms.  Five days ago, she began experiencing ear pain in the right ear, initially feeling clogged as if water was trapped. Attempts to alleviate the sensation by popping the ear resulted in significant pain. The ear pain subsequently radiated to the back of the throat, causing throat pain for two days before it resolved. The ear pain then spread to the left ear, leading to a sensation of congestion in both ears.  Following the ear symptoms, she developed facial congestion affecting the eyes and nose. Two nights ago, she experienced chest congestion and tightness, which did not improve with albuterol  use. She has been experiencing increasing shortness of breath, which worsened today compared to yesterday. She has tried various over-the-counter medications, including DayQuil, NyQuil, Tylenol , ibuprofen , and an allergy pill, without relief. She feels very tired and weak, with symptoms progressively worsening rather than improving.  She has experienced chills but no fever, and sneezing is triggered by temperature changes. She had diarrhea yesterday, using the bathroom four times, which is unusual for her. Today, bowel movements returned to normal. She reports back pain, particularly in the upper shoulder area, which began with the onset of her illness.  Her son was recently sick with a fever and cough, and her son's father has  started feeling sick today.  She has a history of asthma and has been using her sister's inhaler due to issues obtaining her own prescription. The inhaler has not provided relief for her chest tightness and shortness of breath.   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 Authorizing Provider  albuterol  (VENTOLIN  HFA) 108 (90 Base) MCG/ACT inhaler Inhale  1-2 puffs into the lungs every 6 (six) hours as needed for wheezing or shortness of breath. 02/14/24  Yes Caydance Kuehnle E, PA-C  predniSONE  (DELTASONE ) 20 MG tablet Take 2 tablets (40 mg total) by mouth daily for 5 days. 02/14/24 02/19/24 Yes Derrik Mceachern E, PA-C  atorvastatin  (LIPITOR) 20 MG tablet Take 1 tablet (20 mg total) by mouth daily. 11/16/23   Lorren Greig PARAS, NP  cetirizine  (ZYRTEC  ALLERGY) 10 MG tablet Take 1 tablet (10 mg total) by mouth at bedtime. Patient taking differently: Take 10 mg by mouth at bedtime. Taking as needed 09/06/22 11/15/23  Joesph Shaver Scales, PA-C  isoniazid  (NYDRAZID) 300 MG tablet Take by mouth daily. Patient not taking: Reported on 10/03/2023    [provider]  Multiple Vitamin (MULTIVITAMIN) tablet Take 1 tablet by mouth daily.    [provider]    Family History Family History  Problem Relation Age of Onset   Anxiety disorder Mother    Asthma Sister    Hypertension Maternal Aunt    Cancer Maternal Uncle    Migraines Neg Hx    Seizures Neg Hx    Autism Neg Hx    Depression Neg Hx    ADD / ADHD Neg Hx    Bipolar disorder Neg Hx    Schizophrenia Neg Hx    Birth defects Neg Hx    Diabetes Neg Hx    Heart disease Neg Hx    Stroke Neg Hx     Social History Social History   Tobacco Use   Smoking status: Never   Smokeless tobacco: Never  Vaping Use   Vaping status: Never Used  Substance Use Topics   Alcohol use: Never   Drug use: Never     Allergies   Patient has no known allergies.   Review of Systems Review of Systems  Constitutional:  Positive for chills, diaphoresis and fatigue. Negative for fever.  HENT:  Positive for congestion, ear pain (ear fullness as well- largely on the right side), sinus pressure, sinus pain, sneezing and sore throat.   Respiratory:  Positive for chest tightness and shortness of breath.   Gastrointestinal:  Positive for diarrhea. Negative for nausea and vomiting.  Musculoskeletal:  Positive for back pain and myalgias. Negative for arthralgias.     Physical Exam Triage Vital Signs ED Triage Vitals  Encounter Vitals Group     BP 02/14/24 1752 93/69     Girls Systolic BP Percentile --      Girls Diastolic BP Percentile --      Boys Systolic BP Percentile --      Boys Diastolic BP Percentile --      Pulse Rate 02/14/24 1752 82     Resp 02/14/24 1752 18     Temp 02/14/24 1752 98.5 F (36.9 C)     Temp Source 02/14/24 1752 Oral     SpO2 02/14/24 1752 97 %     Weight 02/14/24 1755 213 lb (96.6 kg)     Height 02/14/24 1755 5' (1.524 m)     Head Circumference --       Peak Flow --      Pain Score 02/14/24 1753 5     Pain Loc --      Pain Education --      Exclude from Growth Chart --    No data found.  Updated Vital Signs BP 93/69 (BP Location: Right Arm)   Pulse 82   Temp 98.5 F (36.9  C) (Oral)   Resp 18   Ht 5' (1.524 m)   Wt 213 lb (96.6 kg)   SpO2 97%   BMI 41.60 kg/m   Visual Acuity Right Eye Distance:   Left Eye Distance:   Bilateral Distance:    Right Eye Near:   Left Eye Near:    Bilateral Near:     Physical Exam Vitals reviewed.  Constitutional:      General: She is awake. She is not in acute distress.    Appearance: Normal appearance. She is well-developed and well-groomed. She is not ill-appearing, toxic-appearing or diaphoretic.  HENT:     Head: Normocephalic and atraumatic.     Right Ear: Hearing, tympanic membrane and ear canal normal.     Left Ear: Hearing, tympanic membrane and ear canal normal.     Mouth/Throat:     Lips: Pink.     Mouth: Mucous membranes are moist.     Pharynx: Uvula midline. Pharyngeal swelling and posterior oropharyngeal erythema present. No oropharyngeal exudate, uvula swelling or postnasal drip.     Tonsils: No tonsillar exudate or tonsillar abscesses. 1+ on the right. 1+ on the left.  Cardiovascular:     Rate and Rhythm: Normal rate and regular rhythm.     Pulses: Normal pulses.          Radial pulses are 2+ on the right side and 2+ on the left side.     Heart sounds: Normal heart sounds. No murmur heard.    No friction rub. No gallop.  Pulmonary:     Effort: Pulmonary effort is normal.     Breath sounds: Normal breath sounds. No decreased air movement. No decreased breath sounds, wheezing, rhonchi or rales.  Musculoskeletal:     Cervical back: Normal range of motion and neck supple.  Lymphadenopathy:     Head:     Right side of head: No submental, submandibular or preauricular adenopathy.     Left side of head: No submental, submandibular or preauricular adenopathy.     Cervical:      Right cervical: No superficial cervical adenopathy.    Left cervical: No superficial cervical adenopathy.     Upper Body:     Right upper body: No supraclavicular adenopathy.     Left upper body: No supraclavicular adenopathy.  Skin:    General: Skin is warm and dry.  Neurological:     General: No focal deficit present.     Mental Status: She is alert and oriented to person, place, and time.  Psychiatric:        Mood and Affect: Mood normal.        Behavior: Behavior normal. Behavior is cooperative.        Thought Content: Thought content normal.        Judgment: Judgment normal.      UC Treatments / Results  Labs (all labs ordered are listed, but only abnormal results are displayed) Labs Reviewed  POC COVID19/FLU A&B COMBO  POCT RAPID STREP A (OFFICE)    EKG   Radiology No results found.  Procedures Procedures (including critical care time)  Medications Ordered in UC Medications  ipratropium-albuterol  (DUONEB) 0.5-2.5 (3) MG/3ML nebulizer solution 3 mL (3 mLs Nebulization Given 02/14/24 1826)    Initial Impression / Assessment and Plan / UC Course  I have reviewed the triage vital signs and the nursing notes.  Pertinent labs & imaging results that were available during my care of the patient were reviewed by  me and considered in my medical decision making (see chart for details).      Final Clinical Impressions(s) / UC Diagnoses   Final diagnoses:  Symptoms of upper respiratory infection (URI)   Acute upper respiratory infection with ear and sinus symptoms Acute upper respiratory infection with right ear pain, throat pain, bilateral ear congestion, facial congestion, sneezing, and chills for five days. Negative for fever, cough, nausea, vomiting, and diarrhea. Physical exam shows tonsillar swelling without white patches. COVID-19, flu, and strep tests negative. Likely viral due to recent family illness. - Administer breathing treatment for chest tightness  and shortness of breath. - Start short course of oral steroids to reduce inflammation and assist recovery. - Continue over-the-counter medications for symptom relief. - Advise wearing a mask during clinical duties to prevent infection spread.  Asthma Asthma with recent exacerbation characterized by chest tightness and shortness of breath. Albuterol  inhaler used without significant relief, possibly due to severity of symptoms. Shared inhaler with sibling due to pharmacy issues. - Refill albuterol  inhaler prescription. - Encourage use of inhaler as needed for symptom relief.    Discharge Instructions      VISIT SUMMARY:  You came in today with ear pain and respiratory symptoms that have been worsening over the past five days. You have experienced ear pain, throat pain, facial congestion, chest tightness, and shortness of breath. Your symptoms have not improved with over-the-counter medications or your sister's inhaler. Your son and his father have also been feeling unwell recently.  YOUR PLAN:  -ACUTE UPPER RESPIRATORY INFECTION WITH EAR AND SINUS SYMPTOMS: You have an upper respiratory infection, which is likely viral given your symptoms and recent family illness. This infection is causing your ear pain, throat pain, congestion, and chills. We will give you a breathing treatment today to help with your chest tightness and shortness of breath. You will also start a short course of oral steroids to reduce inflammation and help you recover. Continue using over-the-counter medications for symptom relief, and wear a mask during clinical duties to prevent spreading the infection.  -ASTHMA: Your asthma has been exacerbated, leading to chest tightness and shortness of breath. The albuterol  inhaler you used did not provide significant relief, possibly due to the severity of your symptoms. We will refill your albuterol  inhaler prescription, and you should use it as needed for symptom  relief.  INSTRUCTIONS:  Please follow up if your symptoms do not improve or if they worsen. Use your new albuterol  inhaler as needed, and complete the course of oral steroids as prescribed. Continue using over-the-counter medications for symptom relief and wear a mask to prevent spreading the infection.     ED Prescriptions     Medication Sig Dispense Auth. Provider   albuterol  (VENTOLIN  HFA) 108 (90 Base) MCG/ACT inhaler Inhale 1-2 puffs into the lungs every 6 (six) hours as needed for wheezing or shortness of breath. 8 g Geovannie Vilar E, PA-C   predniSONE  (DELTASONE ) 20 MG tablet Take 2 tablets (40 mg total) by mouth daily for 5 days. 10 tablet Hanley Rispoli E, PA-C      PDMP not reviewed this encounter.   Mont Jagoda E, PA-C 02/14/24 1935

## 2024-02-14 NOTE — Telephone Encounter (Signed)
 Pt would like medications sent to different pharmacy --sent to requested pharmacy.

## 2024-02-14 NOTE — Discharge Instructions (Addendum)
 VISIT SUMMARY:  You came in today with ear pain and respiratory symptoms that have been worsening over the past five days. You have experienced ear pain, throat pain, facial congestion, chest tightness, and shortness of breath. Your symptoms have not improved with over-the-counter medications or your sister's inhaler. Your son and his father have also been feeling unwell recently.  YOUR PLAN:  -ACUTE UPPER RESPIRATORY INFECTION WITH EAR AND SINUS SYMPTOMS: You have an upper respiratory infection, which is likely viral given your symptoms and recent family illness. This infection is causing your ear pain, throat pain, congestion, and chills. We will give you a breathing treatment today to help with your chest tightness and shortness of breath. You will also start a short course of oral steroids to reduce inflammation and help you recover. Continue using over-the-counter medications for symptom relief, and wear a mask during clinical duties to prevent spreading the infection.  -ASTHMA: Your asthma has been exacerbated, leading to chest tightness and shortness of breath. The albuterol  inhaler you used did not provide significant relief, possibly due to the severity of your symptoms. We will refill your albuterol  inhaler prescription, and you should use it as needed for symptom relief.  INSTRUCTIONS:  Please follow up if your symptoms do not improve or if they worsen. Use your new albuterol  inhaler as needed, and complete the course of oral steroids as prescribed. Continue using over-the-counter medications for symptom relief and wear a mask to prevent spreading the infection.

## 2024-02-14 NOTE — ED Triage Notes (Signed)
 Pt presents with complaints of nasal congestion, chest congestion, SOB, sore throat, and bilateral ear fullness x 5 days. OTC Ibuprofen , Tylenol , Cold & Flu taken with no relief. Also mentions taking her albuterol  inhaler for SOB but it did not make any noticeable differences. Currently rates overall pain a 5/10. No fevers. Child is sick, attends daycare.

## 2024-02-15 ENCOUNTER — Other Ambulatory Visit (HOSPITAL_COMMUNITY): Payer: Self-pay

## 2024-02-16 ENCOUNTER — Other Ambulatory Visit: Payer: Self-pay | Admitting: Family

## 2024-02-16 DIAGNOSIS — E785 Hyperlipidemia, unspecified: Secondary | ICD-10-CM

## 2024-02-16 NOTE — Telephone Encounter (Signed)
 Complete

## 2024-05-02 ENCOUNTER — Ambulatory Visit (HOSPITAL_BASED_OUTPATIENT_CLINIC_OR_DEPARTMENT_OTHER): Admitting: Obstetrics and Gynecology

## 2024-06-06 ENCOUNTER — Encounter (HOSPITAL_BASED_OUTPATIENT_CLINIC_OR_DEPARTMENT_OTHER): Payer: Self-pay

## 2024-06-06 ENCOUNTER — Ambulatory Visit (HOSPITAL_BASED_OUTPATIENT_CLINIC_OR_DEPARTMENT_OTHER): Admitting: Obstetrics and Gynecology

## 2024-06-16 ENCOUNTER — Other Ambulatory Visit (HOSPITAL_COMMUNITY): Payer: Self-pay

## 2024-06-16 ENCOUNTER — Encounter

## 2024-06-27 ENCOUNTER — Ambulatory Visit

## 2024-07-31 ENCOUNTER — Ambulatory Visit (HOSPITAL_BASED_OUTPATIENT_CLINIC_OR_DEPARTMENT_OTHER): Payer: Self-pay | Admitting: Obstetrics & Gynecology
